# Patient Record
Sex: Male | Born: 1960 | Race: White | Hispanic: No | Marital: Married | Smoking: Never smoker
Health system: Southern US, Community
[De-identification: ages and names within clinical notes are randomized; demographics above are authoritative.]

## PROBLEM LIST (undated history)

## (undated) DIAGNOSIS — G930 Cerebral cysts: Secondary | ICD-10-CM

## (undated) DIAGNOSIS — E7800 Pure hypercholesterolemia, unspecified: Secondary | ICD-10-CM

## (undated) DIAGNOSIS — R55 Syncope and collapse: Secondary | ICD-10-CM

## (undated) DIAGNOSIS — I10 Essential (primary) hypertension: Secondary | ICD-10-CM

## (undated) DIAGNOSIS — Z87448 Personal history of other diseases of urinary system: Secondary | ICD-10-CM

## (undated) DIAGNOSIS — H6691 Otitis media, unspecified, right ear: Secondary | ICD-10-CM

## (undated) DIAGNOSIS — R7303 Prediabetes: Secondary | ICD-10-CM

## (undated) DIAGNOSIS — I2510 Atherosclerotic heart disease of native coronary artery without angina pectoris: Secondary | ICD-10-CM

## (undated) DIAGNOSIS — Z8614 Personal history of Methicillin resistant Staphylococcus aureus infection: Secondary | ICD-10-CM

## (undated) DIAGNOSIS — K449 Diaphragmatic hernia without obstruction or gangrene: Secondary | ICD-10-CM

## (undated) DIAGNOSIS — H9191 Unspecified hearing loss, right ear: Secondary | ICD-10-CM

## (undated) DIAGNOSIS — K2270 Barrett's esophagus without dysplasia: Secondary | ICD-10-CM

## (undated) DIAGNOSIS — K219 Gastro-esophageal reflux disease without esophagitis: Secondary | ICD-10-CM

## (undated) DIAGNOSIS — C4491 Basal cell carcinoma of skin, unspecified: Secondary | ICD-10-CM

## (undated) HISTORY — DX: Personal history of other diseases of urinary system: Z87.448

## (undated) HISTORY — DX: Syncope and collapse: R55

## (undated) HISTORY — PX: NOSE SURGERY: SHX723

## (undated) HISTORY — DX: Prediabetes: R73.03

## (undated) HISTORY — DX: Basal cell carcinoma of skin, unspecified: C44.91

## (undated) HISTORY — DX: Otitis media, unspecified, right ear: H66.91

## (undated) HISTORY — DX: Personal history of Methicillin resistant Staphylococcus aureus infection: Z86.14

## (undated) HISTORY — DX: Cerebral cysts: G93.0

## (undated) HISTORY — DX: Unspecified hearing loss, right ear: H91.91

## (undated) HISTORY — DX: Barrett's esophagus without dysplasia: K22.70

## (undated) HISTORY — DX: Diaphragmatic hernia without obstruction or gangrene: K44.9

---

## 2001-01-18 ENCOUNTER — Other Ambulatory Visit: Admission: RE | Admit: 2001-01-18 | Discharge: 2001-01-18 | Payer: Self-pay | Admitting: Family Medicine

## 2001-02-09 ENCOUNTER — Encounter (INDEPENDENT_AMBULATORY_CARE_PROVIDER_SITE_OTHER): Payer: Self-pay | Admitting: *Deleted

## 2001-02-09 ENCOUNTER — Ambulatory Visit (HOSPITAL_COMMUNITY): Admission: RE | Admit: 2001-02-09 | Discharge: 2001-02-09 | Payer: Self-pay | Admitting: General Surgery

## 2008-01-18 HISTORY — PX: HERNIA REPAIR: SHX51

## 2008-01-21 ENCOUNTER — Ambulatory Visit (HOSPITAL_COMMUNITY): Admission: RE | Admit: 2008-01-21 | Discharge: 2008-01-21 | Payer: Self-pay | Admitting: General Surgery

## 2008-09-05 ENCOUNTER — Encounter: Admission: RE | Admit: 2008-09-05 | Discharge: 2008-09-05 | Payer: Self-pay | Admitting: General Surgery

## 2009-06-16 ENCOUNTER — Encounter (INDEPENDENT_AMBULATORY_CARE_PROVIDER_SITE_OTHER): Payer: Self-pay | Admitting: *Deleted

## 2009-06-22 ENCOUNTER — Encounter (INDEPENDENT_AMBULATORY_CARE_PROVIDER_SITE_OTHER): Payer: Self-pay | Admitting: *Deleted

## 2009-06-24 ENCOUNTER — Ambulatory Visit: Payer: Self-pay | Admitting: Gastroenterology

## 2009-12-19 ENCOUNTER — Encounter: Admission: RE | Admit: 2009-12-19 | Discharge: 2009-12-19 | Payer: Self-pay | Admitting: Family Medicine

## 2010-02-16 NOTE — Letter (Signed)
Summary: [facility] Instructions  [facility]  [address] [place], [place] [address]   Phone: [phone]  Fax: [phone]       Nathaniel Porter    09/15/1960    MRN: [id number]        Procedure Day /Date:  07/13/09   Monday     Arrival Time:  10:30am      Procedure Time:  11:30am     Location of Procedure:                    _ x_  [facility] (4th Floor)                        PREPARATION FOR COLONOSCOPY WITH MOVIPREP   Starting 5 days prior to your procedure  07/08/09 do not eat nuts, seeds, popcorn, corn, beans, peas,  salads, or any raw vegetables.  Do not take any fiber supplements (e.g. Metamucil, Citrucel, and Benefiber).  THE DAY BEFORE YOUR PROCEDURE         DATE:  07/12/09  DAY:  Sunday  1.  Drink clear liquids the entire day-NO SOLID FOOD  2.  Do not drink anything colored red or purple.  Avoid juices with pulp.  No orange juice.  3.  Drink at least 64 oz. (8 glasses) of fluid/clear liquids during the day to prevent dehydration and help the prep work efficiently.  CLEAR LIQUIDS INCLUDE: Water Jello Ice Popsicles Tea (sugar ok, no milk/cream) Powdered fruit flavored drinks Coffee (sugar ok, no milk/cream) Gatorade Juice: apple, white grape, white cranberry  Lemonade Clear bullion, consomm, broth Carbonated beverages (any kind) Strained chicken noodle soup Hard Candy                             4.  In the morning, mix first dose of MoviPrep solution:    Empty 1 Pouch A and 1 Pouch B into the disposable container    Add lukewarm drinking water to the top line of the container. Mix to dissolve    Refrigerate (mixed solution should be used within 24 hrs)  5.  Begin drinking the prep at 5:00 p.m. The MoviPrep container is divided by 4 marks.   Every 15 minutes drink the solution down to the next Bookert (approximately 8 oz) until the full liter is complete.   6.  Follow completed prep with 16 oz of clear liquid of your choice  (Nothing red or purple).  Continue to drink clear liquids until bedtime.  7.  Before going to bed, mix second dose of MoviPrep solution:    Empty 1 Pouch A and 1 Pouch B into the disposable container    Add lukewarm drinking water to the top line of the container. Mix to dissolve    Refrigerate  THE DAY OF YOUR PROCEDURE      DATE:  07/13/09  DAY: Monday  Beginning at   6:30am (5 hours before procedure):         1. Every 15 minutes, drink the solution down to the next Jarrin (approx 8 oz) until the full liter is complete.  2. Follow completed prep with 16 oz. of clear liquid of your choice.    3. You may drink clear liquids until   9:30am (2 HOURS BEFORE PROCEDURE).   MEDICATION INSTRUCTIONS  Unless otherwise instructed, you should take regular prescription medications with a small sip  of water   as early as possible the morning of your procedure.        OTHER INSTRUCTIONS  You will need a responsible adult at least 52 years of age to accompany you and drive you home.   This person must remain in the waiting room during your procedure.  Wear loose fitting clothing that is easily removed.  Leave jewelry and other valuables at home.  However, you may wish to bring a book to read or  an iPod/MP3 player to listen to music as you wait for your procedure to start.  Remove all body piercing jewelry and leave at home.  Total time from sign-in until discharge is approximately 2-3 hours.  You should go home directly after your procedure and rest.  You can resume normal activities the  day after your procedure.  The day of your procedure you should not:   Drive   Make legal decisions   Operate machinery   Drink alcohol   Return to work  You will receive specific instructions about eating, activities and medications before you leave.    The above instructions have been reviewed and explained to me by   Wyona Almas RN  June 24, 2009 1:26 PM     I fully  understand and can verbalize these instructions _____________________________ Date _________

## 2010-02-16 NOTE — Miscellaneous (Signed)
Summary: [facility] Previsit/prep  Clinical Lists Changes  Medications: Added new medication of MOVIPREP 100 GM  SOLR (PEG-KCL-NACL-NASULF-NA ASC-C) As per prep instructions. - Signed Rx of MOVIPREP 100 GM  SOLR (PEG-KCL-NACL-NASULF-NA ASC-C) As per prep instructions.;  #1 x 0;  Signed;  Entered by: Wyona Almas RN;  Authorized by: Mardella Layman MD [facility];  Method used: Electronically to [facility] [phone]*, [address] [place], [place]  [address], Ph: [phone], Fax: [phone] Allergies: Added new allergy or adverse reaction of AMOXICILLIN Added new allergy or adverse reaction of CODEINE Observations: Added new observation of NKA: F (06/24/2009 12:56)    Prescriptions: MOVIPREP 100 GM  SOLR (PEG-KCL-NACL-NASULF-NA ASC-C) As per prep instructions.  #1 x 0   Entered by:   Wyona Almas RN   Authorized by:   Mardella Layman MD [facility]   Signed by:   Wyona Almas RN on 06/24/2009   Method used:   Electronically to        [facility] [phone]* (retail)       [address]       [place], [place]  [address]       Ph: [phone]       Fax: [phone]   RxID:   [id number]

## 2010-02-16 NOTE — Letter (Signed)
Summary: Previsit letter  [facility]  [address] [place], [place] [address]   Phone: [phone]  Fax: [phone]       06/16/2009 MRN: [id number]  Nathaniel Porter [address] [place], [place]  [address]  Dear Mr. OBARA,  Welcome to the Gastroenterology Division at [facility].    You are scheduled to see a nurse for your pre-procedure visit on 06/24/2009 at 10:30AM on the 3rd floor at [facility], [address]  We ask that you try to arrive at our office 15 minutes prior to your appointment time to allow for check-in.  Your nurse visit will consist of discussing your medical and surgical history, your immediate family medical history, and your medications.    Please bring a complete list of all your medications or, if you prefer, bring the medication bottles and we will list them.  We will need to be aware of both prescribed and over the counter drugs.  We will need to know exact dosage information as well.  If you are on blood thinners (Coumadin, Plavix, Aggrenox, Ticlid, etc.) please call our office today/prior to your appointment, as we need to consult with your physician about holding your medication.   Please be prepared to read and sign documents such as consent forms, a financial agreement, and acknowledgement forms.  If necessary, and with your consent, a friend or relative is welcome to sit-in on the nurse visit with you.  Please bring your insurance card so that we may make a copy of it.  If your insurance requires a referral to see a specialist, please bring your referral form from your primary care physician.  No co-pay is required for this nurse visit.     If you cannot keep your appointment, please call [phone] to cancel or reschedule prior to your appointment date.  This allows [place] the opportunity to schedule an appointment for another patient in need of care.    Thank you for choosing [facility] for your  medical needs.  We appreciate the opportunity to care for you.  Please visit [place] at our website  to learn more about our practice.                     Sincerely.                                                                                                                   The Gastroenterology Division

## 2010-05-03 LAB — COMPREHENSIVE METABOLIC PANEL
ALT: 27 U/L (ref 0–53)
AST: 24 U/L (ref 0–37)
Albumin: 4.1 g/dL (ref 3.5–5.2)
Alkaline Phosphatase: 54 U/L (ref 39–117)
BUN: 15 mg/dL (ref 6–23)
Chloride: 108 mEq/L (ref 96–112)
Potassium: 4.5 mEq/L (ref 3.5–5.1)
Sodium: 143 mEq/L (ref 135–145)
Total Bilirubin: 0.8 mg/dL (ref 0.3–1.2)

## 2010-05-03 LAB — CBC
HCT: 40.7 % (ref 39.0–52.0)
Platelets: 226 10*3/uL (ref 150–400)
WBC: 6.5 10*3/uL (ref 4.0–10.5)

## 2010-05-03 LAB — URINE MICROSCOPIC-ADD ON

## 2010-05-03 LAB — DIFFERENTIAL
Basophils Absolute: 0.1 10*3/uL (ref 0.0–0.1)
Basophils Relative: 1 % (ref 0–1)
Eosinophils Absolute: 0.2 10*3/uL (ref 0.0–0.7)
Eosinophils Relative: 3 % (ref 0–5)
Monocytes Absolute: 0.6 10*3/uL (ref 0.1–1.0)

## 2010-05-03 LAB — URINALYSIS, ROUTINE W REFLEX MICROSCOPIC
Glucose, UA: NEGATIVE mg/dL
Ketones, ur: NEGATIVE mg/dL
Protein, ur: NEGATIVE mg/dL

## 2010-06-01 NOTE — Op Note (Signed)
Nathaniel Porter, Nathaniel Porter               ACCOUNT NO.:  [id number]   MEDICAL RECORD NO.:  [id number]          PATIENT TYPE:  AMB   LOCATION:  DAY                          FACILITY:  [facility]   PHYSICIAN:  Sharlet Salina T. Hoxworth, M.D.DATE OF BIRTH:  12-05-1960   DATE OF PROCEDURE:  01/21/2008  DATE OF DISCHARGE:                               OPERATIVE REPORT   PREOPERATIVE DIAGNOSIS:  Right inguinal hernia.   POSTOPERATIVE DIAGNOSIS:  Right inguinal hernia.   SURGICAL PROCEDURES:  Laparoscopic repair of right inguinal hernia.   SURGEON:  Lorne Skeens. Hoxworth, M.D.   ANESTHESIA:  General.   BRIEF HISTORY:  Nathaniel Porter is a 50 year old male who presents with a  symptomatic right inguinal hernia confirmed on exam.  After discussion  of treatment options, we have elected to proceed with laparoscopic  repair.  The nature of the procedure, indications, risks of bleeding,  infection, recurrence, visceral injury and possible need for open  procedure were discussed and understood.  He is now brought to the  operating room for this procedure.   DESCRIPTION OF OPERATION:  The patient was brought to the operating room  and placed in supine position on the operating table and general  orotracheal anesthesia was induced.  The abdomen and groins down to the  thighs were widely sterilely prepped and draped.  Foley catheter had  been inserted.  He received preoperative IV antibiotics.  Correct  patient and procedure were verified.  The trocar sites were infiltrated  local anesthesia.  A 1 cm incision was made at the umbilicus and  dissection carried down to the anterior fascia which was incised  transversely for 1 cm and the medial edge of the right rectus muscle  identified and retracted laterally and the preperitoneal space entered  under direct vision.  The balloon-tipped dissector was passed down the  midline toward the pubic symphysis.  Under laparoscopic vision the  balloon was inflated with good  bilateral deployment and dissection of  the preperitoneal space.  It was left in place for a couple minutes for  hemostasis and then removed and the balloon trocar placed and CO2  pressure applied.  There had been very good dissection of the  preperitoneal space.  The pubic symphysis and Cooper's ligament were  identified and completely cleared down to the iliac vessels which were  identified and protected.  The peritoneal edge was identified out  laterally and then was further dissected laterally until the peritoneum  was completely stripped back up to the level of the umbilicus at the  anterior-superior iliac spine.  The perineum was then followed back  medially and was completely dissected off of the cord structures well  posteriorly.  There was a moderate-sized direct defect that had been  completely dissected by the balloon.  The large Bard 3-D Max right-sided  piece of mesh was chosen, placed in the preperitoneal space and  oriented.  It was then tacked medially to the pubis along Cooper's  ligament and laterally being able to feel tacks through the anterior  abdominal wall.  The superior edge was tacked back working  medially and  around the medial edge as well.  This provided very broad coverage of  the direct and indirect spaces.  The operative site inspected for  hemostasis.  There  was no bleeding.  The lateral edge of the mesh was held in place.  All  CO2 was evacuated.  Trocars removed.  The fascia at the umbilicus was  closed with figure-of-eight 0 Vicryl.  Skin was closed with subcuticular  Monocryl and Dermabond.  Sponge and needle counts were correct.  He was  taken to recovery in good condition.      Lorne Skeens. Hoxworth, M.D.  Electronically Signed     BTH/MEDQ  D:  01/21/2008  T:  01/21/2008  Job:  [id number]

## 2010-06-04 NOTE — Op Note (Signed)
[facility]  Patient:    Nathaniel Porter, Nathaniel Porter Visit Number: [id number] MRN: [id number]          Service Type: DSU Location: DAY Attending Physician:  Delsa Bern Dictated by:   Lorne Skeens. Hoxworth, M.D. Proc. Date: 02/09/01 Admit Date:  02/09/2001                             Operative Report  PREOPERATIVE DIAGNOSIS:  Recurrent basal cell carcinoma, skin left shoulder x 2.  POSTOPERATIVE DIAGNOSIS:  Recurrent basal cell carcinoma, skin left shoulder x 2.  SURGICAL PROCEDURE:  Excision of same.  SURGEON:  Lorne Skeens. Hoxworth, M.D.  ANESTHESIA:  General.  BRIEF HISTORY:  Nathaniel Porter is a 50 year old male, who approximately two and a half years ago underwent excision of two skin lesions on the top of his left shoulder which proved to be basal cell carcinoma.  Margins at that time appeared negative.  The patient has developed two recurrent areas of slightly raised erythematous, scaly skin lesions on the dorsum of the left shoulder, one measuring 4.5 x 2.5 cm, the other measuring 1.5 cm in diameter more laterally over the top of the shoulder.  Re-excision of these areas for negative margins has recommended and accepted.  The nature of the procedure, its indications, risks of bleeding, infection, and wound healing problems were discussed and understood.  He is now brought to the operating room for the procedure.  DESCRIPTION OF PROCEDURE:  The patient was brought to the operating room and placed in supine position on the operating room table, and general endotracheal anesthesia was induced.  He was carefully bumped up slightly onto his right side and padded, exposing the top of the left shoulder which was sterilely prepped and draped.  Elliptical excisions of each area along skin lines were planned with the medial excision measuring 6 x 3 cm, and the more lateral excision measuring 3 x 2 cm.  In each case, a full-thickness elliptical  excision with grossly negative margin was performed.  On the medial lesion, skin and subcutaneous flaps were raised for a couple of centimeters to allow closure under less tension.  Hemostasis was obtained with cautery.  The tissue was infiltrated with 0.25% Marcaine.  The subcu was then closed with interrupted 3-0 Vicryl and the skin with running mattress suture of 4-0 nylon in each case.  These closed with minimal tension.  Sponge, needle, and instrument counts were correct.  Dry sterile dressings were applied, and the patient was taken to recovery in satisfactory condition. Dictated by:   Lorne Skeens. Hoxworth, M.D. Attending Physician:  Delsa Bern DD:  02/09/01 TD:  02/10/01 Job: [id number]

## 2010-09-15 ENCOUNTER — Other Ambulatory Visit: Payer: Self-pay | Admitting: Dermatology

## 2010-12-15 ENCOUNTER — Other Ambulatory Visit: Payer: Self-pay | Admitting: Dermatology

## 2011-03-16 ENCOUNTER — Other Ambulatory Visit: Payer: Self-pay | Admitting: Dermatology

## 2011-08-25 ENCOUNTER — Other Ambulatory Visit: Payer: Self-pay | Admitting: Dermatology

## 2012-01-18 HISTORY — PX: OTHER SURGICAL HISTORY: SHX169

## 2012-02-22 ENCOUNTER — Other Ambulatory Visit: Payer: Self-pay | Admitting: Dermatology

## 2012-03-16 ENCOUNTER — Other Ambulatory Visit: Payer: Self-pay | Admitting: Dermatology

## 2012-04-22 ENCOUNTER — Encounter (HOSPITAL_COMMUNITY): Payer: Self-pay | Admitting: *Deleted

## 2012-04-22 ENCOUNTER — Emergency Department (HOSPITAL_COMMUNITY)
Admission: EM | Admit: 2012-04-22 | Discharge: 2012-04-22 | Disposition: A | Discharge status: Home / Self Care | Payer: No Typology Code available for payment source | Attending: Emergency Medicine | Admitting: Emergency Medicine

## 2012-04-22 ENCOUNTER — Emergency Department (HOSPITAL_COMMUNITY): Payer: No Typology Code available for payment source

## 2012-04-22 DIAGNOSIS — Z79899 Other long term (current) drug therapy: Secondary | ICD-10-CM | POA: Insufficient documentation

## 2012-04-22 DIAGNOSIS — Y92410 Unspecified street and highway as the place of occurrence of the external cause: Secondary | ICD-10-CM | POA: Insufficient documentation

## 2012-04-22 DIAGNOSIS — S139XXA Sprain of joints and ligaments of unspecified parts of neck, initial encounter: Secondary | ICD-10-CM | POA: Insufficient documentation

## 2012-04-22 DIAGNOSIS — K219 Gastro-esophageal reflux disease without esophagitis: Secondary | ICD-10-CM | POA: Insufficient documentation

## 2012-04-22 DIAGNOSIS — S161XXA Strain of muscle, fascia and tendon at neck level, initial encounter: Secondary | ICD-10-CM

## 2012-04-22 DIAGNOSIS — Y9389 Activity, other specified: Secondary | ICD-10-CM | POA: Insufficient documentation

## 2012-04-22 DIAGNOSIS — E7800 Pure hypercholesterolemia, unspecified: Secondary | ICD-10-CM | POA: Insufficient documentation

## 2012-04-22 HISTORY — DX: Gastro-esophageal reflux disease without esophagitis: K21.9

## 2012-04-22 HISTORY — DX: Pure hypercholesterolemia, unspecified: E78.00

## 2012-04-22 MED ORDER — HYDROCODONE-ACETAMINOPHEN 5-325 MG PO TABS: 1.0000 | ORAL_TABLET | Freq: Four times a day (QID) | ORAL | Status: DC | PRN

## 2012-04-22 MED ORDER — METHOCARBAMOL 500 MG PO TABS: 500.0000 mg | ORAL_TABLET | Freq: Two times a day (BID) | ORAL | Status: DC
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
## 2012-04-22 NOTE — ED Provider Notes (Signed)
Medical screening examination/treatment/procedure(s) were performed by non-physician practitioner and as supervising physician I was immediately available for consultation/collaboration.  Merl Guardino, MD 04/22/12 1532 
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
## 2012-05-15 ENCOUNTER — Telehealth: Payer: Self-pay | Admitting: Family Medicine

## 2012-05-15 NOTE — Telephone Encounter (Signed)
Left patient mess to call here and schedule follow up appt.

## 2012-05-16 ENCOUNTER — Encounter: Payer: Self-pay | Admitting: Family Medicine

## 2012-05-16 DIAGNOSIS — C4491 Basal cell carcinoma of skin, unspecified: Secondary | ICD-10-CM | POA: Insufficient documentation

## 2012-05-16 DIAGNOSIS — Z8614 Personal history of Methicillin resistant Staphylococcus aureus infection: Secondary | ICD-10-CM | POA: Insufficient documentation

## 2012-05-16 DIAGNOSIS — Z87448 Personal history of other diseases of urinary system: Secondary | ICD-10-CM | POA: Insufficient documentation

## 2012-05-16 DIAGNOSIS — K449 Diaphragmatic hernia without obstruction or gangrene: Secondary | ICD-10-CM | POA: Insufficient documentation

## 2012-05-16 DIAGNOSIS — H9191 Unspecified hearing loss, right ear: Secondary | ICD-10-CM | POA: Insufficient documentation

## 2012-05-16 DIAGNOSIS — G930 Cerebral cysts: Secondary | ICD-10-CM | POA: Insufficient documentation

## 2012-05-16 DIAGNOSIS — H6691 Otitis media, unspecified, right ear: Secondary | ICD-10-CM | POA: Insufficient documentation

## 2012-05-16 DIAGNOSIS — K2270 Barrett's esophagus without dysplasia: Secondary | ICD-10-CM | POA: Insufficient documentation

## 2012-05-16 DIAGNOSIS — K219 Gastro-esophageal reflux disease without esophagitis: Secondary | ICD-10-CM | POA: Insufficient documentation

## 2012-05-16 DIAGNOSIS — E7800 Pure hypercholesterolemia, unspecified: Secondary | ICD-10-CM | POA: Insufficient documentation

## 2012-05-17 ENCOUNTER — Encounter: Payer: Self-pay | Admitting: Physician Assistant

## 2012-05-17 ENCOUNTER — Ambulatory Visit (INDEPENDENT_AMBULATORY_CARE_PROVIDER_SITE_OTHER): Payer: BC Managed Care – PPO | Admitting: Physician Assistant

## 2012-05-17 VITALS — BP 122/70 | HR 72 | Temp 97.3°F | Resp 18 | Ht 68.0 in | Wt 155.0 lb

## 2012-05-17 DIAGNOSIS — R413 Other amnesia: Secondary | ICD-10-CM
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
## 2012-05-30 ENCOUNTER — Encounter: Payer: Self-pay | Admitting: Neurology

## 2012-05-30 DIAGNOSIS — R55 Syncope and collapse: Secondary | ICD-10-CM

## 2012-05-31 ENCOUNTER — Ambulatory Visit (INDEPENDENT_AMBULATORY_CARE_PROVIDER_SITE_OTHER): Payer: BC Managed Care – PPO | Admitting: Neurology

## 2012-05-31 ENCOUNTER — Encounter: Payer: Self-pay | Admitting: Neurology

## 2012-05-31 DIAGNOSIS — R55 Syncope and collapse: Secondary | ICD-10-CM

## 2012-05-31 DIAGNOSIS — R413 Other amnesia: Secondary | ICD-10-CM
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
## 2012-06-01 LAB — SEDIMENTATION RATE: Sed Rate: 2 mm/hr (ref 0–30)

## 2012-07-09 ENCOUNTER — Other Ambulatory Visit: Payer: Self-pay | Admitting: Dermatology

## 2012-08-06 LAB — HM COLONOSCOPY

## 2012-08-23 ENCOUNTER — Other Ambulatory Visit: Payer: Self-pay | Admitting: Dermatology

## 2012-11-22 ENCOUNTER — Other Ambulatory Visit: Payer: Self-pay

## 2012-12-24 ENCOUNTER — Encounter: Payer: Self-pay | Admitting: Physician Assistant

## 2012-12-24 ENCOUNTER — Ambulatory Visit (INDEPENDENT_AMBULATORY_CARE_PROVIDER_SITE_OTHER): Payer: BC Managed Care – PPO | Admitting: Physician Assistant

## 2012-12-24 VITALS — BP 118/78 | HR 72 | Temp 98.2°F | Resp 18 | Wt 158.0 lb

## 2012-12-24 DIAGNOSIS — J029 Acute pharyngitis, unspecified: Secondary | ICD-10-CM

## 2012-12-24 DIAGNOSIS — R52 Pain, unspecified: Secondary | ICD-10-CM

## 2012-12-24 DIAGNOSIS — J020 Streptococcal pharyngitis: Secondary | ICD-10-CM

## 2012-12-24 LAB — INFLUENZA A AND B
Inflenza A Ag: NEGATIVE
Influenza B Ag: NEGATIVE

## 2012-12-24 LAB — RAPID STREP SCREEN (MED CTR MEBANE ONLY): Streptococcus, Group A Screen (Direct): POSITIVE — AB

## 2012-12-24 MED ORDER — AZITHROMYCIN 250 MG PO TABS: ORAL_TABLET | ORAL | Status: DC

## 2012-12-24 NOTE — Progress Notes (Signed)
Patient ID: EDDI HYMES MRN: [id number], DOB: 05-01-1960, 52 y.o. Date of Encounter: 12/24/2012, 12:37 PM    Chief Complaint:  Chief Complaint  Patient presents with  . sore throat, fever, achy all weekend     HPI: 52 y.o. year old white male  With that he began to develop a sore throat on the afternoon of Friday, 12/21/2012. Continued to have sore throat since then. As well he has been having some fevers mostly in the evening. Last night was at 100.6. He's had no mucus from his nose. No cough or chest congestion.     Home Meds: See attached medication section for any medications that were entered at today's visit. The computer does not put those onto this list.The following list is a list of meds entered prior to today's visit.   Current Outpatient Prescriptions on File Prior to Visit  Medication Sig Dispense Refill  . atorvastatin (LIPITOR) 80 MG tablet Take 40 mg by mouth daily.      . fish oil-omega-3 fatty acids 1000 MG capsule Take 1 g by mouth daily.      Marland Kitchen OMEPRAZOLE PO Take 1 tablet by mouth daily.       No current facility-administered medications on file prior to visit.    Allergies:  Allergies  Allergen Reactions  . Amoxicillin     REACTION: Rash/shortness of breath  . Codeine     REACTION: iritability  . Penicillins       Review of Systems: See HPI for pertinent ROS. All other ROS negative.    Physical Exam: Blood pressure 118/78, pulse 72, temperature 98.2 F (36.8 C), temperature source Oral, resp. rate 18, weight 158 lb (71.668 kg)., Body mass index is 24.03 kg/(m^2). General:  WNWD WM Appears in no acute distress. HEENT: Normocephalic, atraumatic, eyes without discharge, sclera non-icteric, nares are without discharge. Bilateral auditory canals clear, TM's are without perforation, pearly grey and translucent with reflective cone of light bilaterally. Oral cavity moist, posterior pharynx without exudate, peritonsillar abscess. Exterior pharynx is  with moderate to severe erythema. Deep beefy red. No exudate.  Neck: Supple. No thyromegaly. Mildly tender bilateral cervical lymph nodes. Minimally enlarged. Lungs: Clear bilaterally to auscultation without wheezes, rales, or rhonchi. Breathing is unlabored. Heart: Regular rhythm. No murmurs, rubs, or gallops. Msk:  Strength and tone normal for age. Extremities/Skin: Warm and dry. No clubbing or cyanosis. No edema. No rashes or suspicious lesions. Neuro: Alert and oriented X 3. Moves all extremities spontaneously. Gait is normal. CNII-XII grossly in tact. Psych:  Responds to questions appropriately with a normal affect.   Results for orders placed in visit on 12/24/12  RAPID STREP SCREEN      Result Value Range   Source THROAT     Streptococcus, Group A Screen (Direct) POS (*) NEGATIVE  INFLUENZA A AND B      Result Value Range   Source-INFBD NASAL     Inflenza A Ag NEG  Negative   Influenza B Ag NEG  Negative     ASSESSMENT AND PLAN:  52 y.o. year old male with  1. Strep pharyngitis Allergy to penicillin and amoxicillin. - azithromycin (ZITHROMAX) 250 MG tablet; Take 2 daily for 5 days  Dispense: 10 tablet; Refill: 0 Note out of work Monday, 12/24/2012 through Wednesday, 12/26/2012. Use lozenges sprays Tylenol Motrin as needed for pain. F/U if needed.  2. Sore throat - Rapid Strep Screen - Influenza a and b  3. Body aches - Rapid  Strep Screen - Influenza a and b   Signed, [address] [place], [place], [facility] 12/24/2012 12:37 PM

## 2013-01-07 ENCOUNTER — Encounter: Payer: Self-pay | Admitting: Physician Assistant

## 2013-01-07 ENCOUNTER — Ambulatory Visit (INDEPENDENT_AMBULATORY_CARE_PROVIDER_SITE_OTHER): Payer: BC Managed Care – PPO | Admitting: Physician Assistant

## 2013-01-07 VITALS — BP 144/98 | HR 80 | Temp 98.2°F | Resp 18 | Wt 159.0 lb

## 2013-01-07 DIAGNOSIS — J029 Acute pharyngitis, unspecified: Secondary | ICD-10-CM

## 2013-01-07 DIAGNOSIS — J020 Streptococcal pharyngitis: Secondary | ICD-10-CM

## 2013-01-07 MED ORDER — AZITHROMYCIN 250 MG PO TABS: ORAL_TABLET | ORAL | Status: DC

## 2013-01-07 NOTE — Progress Notes (Signed)
Patient ID: Nathaniel Porter MRN: [id number], DOB: 1960-07-01, 52 y.o. Date of Encounter: 01/07/2013, 2:56 PM    Chief Complaint:  Chief Complaint  Patient presents with  . recurrent sore throat     HPI: 52 y.o. year old white male was seen here by me on 12/24/12. At that time his only symptom was sore for 3 days and some fever. Strep test was positive. He is allergic to penicillin/amoxicillin he was treated with azithromycin 500 mg daily x5 days. He says that he did take all the antibiotic as directed. Says that the sore throat resolved and he was feeling normal.  However, on Friday 01/04/13 he started to develop sore throat again. He is continued with sore throat since then. He has very minimal nasal congestion. Getting no mucus or drainage out of his nose. With this episode he has had no fever. No chest congestion or cough. No earache.  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     Home Meds: See attached medication section for any medications that were entered at today's visit. The computer does not put those onto this list.The following list is a list of meds entered prior to today's visit.   Current Outpatient Prescriptions on File Prior to Visit  Medication Sig Dispense Refill  . atorvastatin (LIPITOR) 80 MG tablet Take 40 mg by mouth daily.      . fish oil-omega-3 fatty acids 1000 MG capsule Take 1 g by mouth daily.      Marland Kitchen OMEPRAZOLE PO Take 1 tablet by mouth daily.       No current facility-administered medications on file prior to visit.    Allergies:  Allergies  Allergen Reactions  . Amoxicillin     REACTION: Rash/shortness of breath  . Codeine     REACTION: iritability  . Penicillins       Review of Systems: See HPI for pertinent ROS. All other ROS negative.    Physical Exam: Blood pressure 144/98, pulse 80, temperature 98.2 F (36.8 C), temperature source Oral, resp. rate  18, weight 159 lb (72.122 kg)., Body mass index is 24.18 kg/(m^2). General:  WNWD wM. Appears in no acute distress. HEENT: Normocephalic, atraumatic, eyes without discharge, sclera non-icteric, nares are without discharge. Bilateral auditory canals clear, TM's are without perforation, pearly grey and translucent with reflective cone of light bilaterally. Oral cavity moist, posterior pharynx without exudate, peritonsillar abscess. Minimal erythema.  Neck: Supple. No thyromegaly. No lymphadenopathy. Lungs: Clear bilaterally to auscultation without wheezes, rales, or rhonchi. Breathing is unlabored. Heart: Regular rhythm. No murmurs, rubs, or gallops. Msk:  Strength and tone normal for age. Extremities/Skin: Warm and dry. No clubbing or cyanosis. No edema. No rashes or suspicious lesions. Neuro: Alert and oriented X 3. Moves all extremities spontaneously. Gait is normal. CNII-XII grossly in tact. Psych:  Responds to questions appropriately with a normal affect.   Results for orders placed in visit on 01/07/13  RAPID STREP SCREEN      Result Value Range   Source THROAT     Streptococcus, Group A Screen (Direct) NEG  NEGATIVE     ASSESSMENT AND PLAN:  52 y.o. year old male with  1. Acute pharyngitis  2. Sore throat - Rapid Strep Screen  It sounds like his strep pharyngitis treated on 12/24/12 resolved with the azithromycin. It sounds like he has probably gotten a new infection now.  The RST is negative. Most likely, this infection is viral.   I discussed this with the patient. Discussed that if this infection is viral and it will run its course and spontaneously resolve. He agrees that he will treat this as a virus for now with symptomatic management. However, given that the Christmas holiday he is in just a few days, I will go ahead and send in a prescription for antibiotic in case this is needed. However he agrees that he will only get picked this up if his symptoms significantly worsen or if  they are not resolving after another 2 days. In the interim, he is to use lozenges and spray and Tylenol/Motrin to help with the pain. If he does start the antibiotics and he is to complete all of the antibiotics.   Murray Hodgkins Vance, [place], [facility] 01/07/2013 2:56 PM

## 2013-02-05 ENCOUNTER — Other Ambulatory Visit: Payer: Self-pay | Admitting: Family Medicine

## 2013-03-08 ENCOUNTER — Telehealth: Payer: Self-pay | Admitting: Family Medicine

## 2013-03-08 NOTE — Telephone Encounter (Signed)
2 week C/O rt flank pain.  Nagging pain.  No problems with bowels or bladder.  Thought it was muscle but not going away.  Wanted to be seen today but no appt available.  Offered appt for next week.  Pt said he would call back if still wants to be seen.

## 2013-03-12 ENCOUNTER — Ambulatory Visit (INDEPENDENT_AMBULATORY_CARE_PROVIDER_SITE_OTHER): Payer: BC Managed Care – PPO | Admitting: Family Medicine

## 2013-03-12 ENCOUNTER — Encounter: Payer: Self-pay | Admitting: Family Medicine

## 2013-03-12 VITALS — BP 134/70 | HR 80 | Temp 97.1°F | Resp 16 | Ht 66.0 in | Wt 159.0 lb

## 2013-03-12 DIAGNOSIS — M545 Low back pain, unspecified: Secondary | ICD-10-CM

## 2013-03-12 DIAGNOSIS — R079 Chest pain, unspecified: Secondary | ICD-10-CM

## 2013-03-12 NOTE — Progress Notes (Signed)
Subjective:    Patient ID: Nathaniel Porter, male    DOB: 09/10/1960, 53 y.o.   MRN: [id number]  HPI Patient presents with atypical chest pain the last 2-3 weeks. It occurs on a daily basis. It is substernal in location. It is not associated with exercise. There is some mild shortness of breath. He describes the pain as a constant pressure sensation. He does have a past medical history of a hiatal hernia. However he denies any indigestion. He denies any melena. He denies any pleurisy or cough.  He is also concerned because he is having right flank pain for one month. Began he lifted some heavy equipment. He denies any sciatica. The pain is located over the right lower latissimus dorsi. He denies any hematuria dysuria or frequency. Past Medical History  Diagnosis Date  . GERD (gastroesophageal reflux disease)   . High cholesterol   . Cerebral cyst   . Chronic otitis media of right ear   . 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 H/O hematuria   . Barrett esophagus   . Hiatal hernia   . Hx MRSA infection     nose  . Syncope   . BCC (basal cell carcinoma)    Past Surgical History  Procedure Laterality Date  . Hernia repair Right 01/2008  . Nose surgery      x 3-4  . Mole surgery  2014    basil cell   Current Outpatient Prescriptions on File Prior to Visit  Medication Sig Dispense Refill  . atorvastatin (LIPITOR) 80 MG tablet TAKE 1 TABLET BY MOUTH EVERY DAY  30 tablet  6  . fish oil-omega-3 fatty acids 1000 MG capsule Take 1 g by mouth daily.       No current facility-administered medications on file prior to visit.   Allergies  Allergen Reactions  . Amoxicillin     REACTION: Rash/shortness of breath  . Codeine     REACTION: iritability  . Penicillins    History   Social History  . Marital Status: Married    Spouse Name: N/A    Number of Children: 2  . Years of Education: 12   Occupational History  .  [place] History Main Topics  . Smoking status: Never  Smoker   . Smokeless tobacco: Never Used  . Alcohol Use: No  . Drug Use: No  . Sexual Activity: Not on file   Other Topics Concern  . Not on file   Social History Narrative   Patient lives at home with his wife . 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 He denies smoking, drinking.      Review of Systems  All other systems reviewed and are negative.       Objective:   Physical Exam  Vitals reviewed. Constitutional: He appears well-developed and well-nourished.  Cardiovascular: Normal rate, regular rhythm and normal heart sounds.  Exam reveals no gallop and no friction rub.   No murmur heard. Pulmonary/Chest: Effort normal and breath sounds normal. No respiratory distress. He has no wheezes. He has no rales. He exhibits no tenderness.  Abdominal: Soft. Bowel sounds are normal. He exhibits no distension and no mass. There is no tenderness. There is no rebound and no guarding.  Musculoskeletal:       Lumbar back: He exhibits tenderness and pain. He exhibits normal range of motion, no bony tenderness and no spasm.   EKG shows  normal sinus rhythm 75 beats per minute with no evidence of ischemia or infarction.       Assessment & Plan:  1. Chest pain Chest pain is atypical in nature. I cannot exclude a cardiac cause for his chest pain. I will set the patient up as an outpatient for possible exercise treadmill test given his age, his history of hyperlipidemia. That being said, I believe the most likely source of his pain is likely his hiatal hernia. Also patient on dexilant 60 mg by mouth daily and discontinue omeprazole. Objective patient back in one week. If stress test is normal index on is not helping I recommended chest x-ray - EKG 12-Lead - Ambulatory referral to Cardiology  2. Right-sided low back pain without sciatica I suspect muscle strain in the right side of his back. Once his chest pain is better we  can try the patient on a taper pack of prednisone. However I am concerned prednisone or NSAIDs made exacerbate his hiatal hernia and therefore I will not prescribe them at the present time. I explained this at length to the patient and he is amenable to that plan.

## 2013-03-20 ENCOUNTER — Other Ambulatory Visit: Payer: Self-pay | Admitting: Family Medicine

## 2013-03-20 MED ORDER — OMEPRAZOLE MAGNESIUM 20 MG PO TBEC: 20.0000 mg | DELAYED_RELEASE_TABLET | Freq: Every day | ORAL | Status: DC

## 2013-03-22 ENCOUNTER — Telehealth: Payer: Self-pay | Admitting: Physician Assistant

## 2013-03-22 NOTE — Telephone Encounter (Signed)
Call back number is [phone] Pharmacy [facility] Pt is calling in regards to the sample Dexalant that Dr Dennard Schaumann gave him he states that it helps okay. It kinda messes with his stomach and helps out with the refluxes some

## 2013-03-25 MED ORDER — PREDNISONE 20 MG PO TABS: ORAL_TABLET | ORAL | Status: DC

## 2013-03-25 NOTE — Telephone Encounter (Signed)
Spoke to pt 's wife and chest pain is better but back is still hurting. Informed wife that I will send in prednisone and if he is no better then he needs a f/u appt.  Med sent to pharm.

## 2013-03-25 NOTE — Telephone Encounter (Signed)
If his chest pain is no better, NTBS.  If better, I would try a prednisone taper pack for his back pain if still necessary.

## 2013-03-29 ENCOUNTER — Telehealth: Payer: Self-pay | Admitting: Physician Assistant

## 2013-03-29 ENCOUNTER — Telehealth: Payer: Self-pay | Admitting: *Deleted

## 2013-03-29 DIAGNOSIS — K219 Gastro-esophageal reflux disease without esophagitis: Secondary | ICD-10-CM

## 2013-03-29 DIAGNOSIS — K449 Diaphragmatic hernia without obstruction or gangrene: Secondary | ICD-10-CM

## 2013-03-29 MED ORDER — DEXLANSOPRAZOLE 60 MG PO CPDR: 60.0000 mg | DELAYED_RELEASE_CAPSULE | Freq: Every day | ORAL | Status: DC

## 2013-03-29 NOTE — Telephone Encounter (Signed)
Received fax from pharmacy requesting PA for [place].   Pa submitted.

## 2013-03-29 NOTE — Telephone Encounter (Signed)
Call back number is [phone] Pharmacy is [facility] PT was given samples of  Dexilant and his wife has called stating that her husband is wanting a regular prescription and if he can, can you please call and let him know that he will be able to have it

## 2013-03-29 NOTE — Telephone Encounter (Signed)
Refill completed and patient aware

## 2013-04-03 ENCOUNTER — Ambulatory Visit (INDEPENDENT_AMBULATORY_CARE_PROVIDER_SITE_OTHER): Payer: BC Managed Care – PPO | Admitting: Cardiovascular Disease

## 2013-04-03 ENCOUNTER — Encounter: Payer: Self-pay | Admitting: Cardiovascular Disease

## 2013-04-03 VITALS — BP 117/75 | HR 78 | Ht 66.0 in | Wt 157.4 lb

## 2013-04-03 DIAGNOSIS — R079 Chest pain, unspecified: Secondary | ICD-10-CM

## 2013-04-03 DIAGNOSIS — K449 Diaphragmatic hernia without obstruction or gangrene: Secondary | ICD-10-CM

## 2013-04-03 DIAGNOSIS — E7800 Pure hypercholesterolemia, unspecified: Secondary | ICD-10-CM

## 2013-04-03 NOTE — Progress Notes (Signed)
Patient ID: Nathaniel Porter, male   DOB: 1960-11-04, 53 y.o.   MRN: [id number] Referred by Dr Dennard Schaumann  with atypical chest pain the last 2-3 weeks. It occurs on a daily basis. It is substernal in location. It is not associated with exercise. There is some mild shortness of breath. He describes the pain as a constant pressure sensation. He does have a past medical history of a hiatal hernia. However he denies any indigestion. He denies any melena. He denies any pleurisy or cough. Pain can be worse at night.  Some relief with dexilant  Had normal stress test about 10 years ago.  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  No excess ETOH, smoking or drugs.  Denies palpitations syncope No recent trauma.      ROS: Denies fever, malais, weight loss, blurry vision, decreased visual acuity, cough, sputum, SOB, hemoptysis, pleuritic pain, palpitaitons, heartburn, abdominal pain, melena, lower extremity edema, claudication, or rash.  All other systems reviewed and negative   General: Affect appropriate Healthy:  appears stated age 53: normal Neck supple with no adenopathy JVP normal no bruits no thyromegaly Lungs clear with no wheezing and good diaphragmatic motion Heart:  S1/S2 no murmur,rub, gallop or click PMI normal Abdomen: benighn, BS positve, no tenderness, no AAA no bruit.  No HSM or HJR Distal pulses intact with no bruits No edema Neuro non-focal Skin warm and dry No muscular weakness  Medications Current Outpatient Prescriptions  Medication Sig Dispense Refill  . atorvastatin (LIPITOR) 80 MG tablet TAKE 1 TABLET BY MOUTH EVERY DAY  30 tablet  6  . dexlansoprazole (DEXILANT) 60 MG capsule Take 1 capsule (60 mg total) by mouth daily.  30 capsule  5  . fish oil-omega-3 fatty acids 1000 MG capsule Take 1 g by mouth daily.      . predniSONE (DELTASONE) 20 MG tablet 3 tabs po qd x 2 days, 2 tabs po qd x 2 days, 1 tab po qd x 2 days  12 tablet  0   No current  facility-administered medications for this visit.    Allergies Amoxicillin; Codeine; and Penicillins  Family History: Family History  Problem Relation Age of Onset  . Cancer Father     bladder  . Hypertension Brother   . Diabetes Paternal Uncle     Social History: History   Social History  . Marital Status: Married    Spouse Name: N/A    Number of Children: 2  . Years of Education: 12   Occupational History  .  [place] History Main Topics  . Smoking status: Never Smoker   . Smokeless tobacco: Never Used  . Alcohol Use: No  . Drug Use: No  . Sexual Activity: Not on file   Other Topics Concern  . Not on file   Social History Narrative   Patient lives at home with his wife . 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 He denies smoking, drinking.    Electrocardiogram:  NS[facility] normal ECG  [facility]'s office 2/15  Assessment and Plan

## 2013-04-03 NOTE — Patient Instructions (Signed)
Your physician recommends that you schedule a follow-up appointment in:   AS NEEDED   Your physician recommends that you continue on your current medications as directed. Please refer to the Current Medication list given to you today.   Your physician has requested that you have a stress echocardiogram. For further information please visit [email or web address]. Please follow instruction sheet as given.  

## 2013-04-03 NOTE — Assessment & Plan Note (Signed)
Continue lipitor would consider decreasing dose to 40 mg to reduce side effect potential

## 2013-04-03 NOTE — Assessment & Plan Note (Signed)
May be the source of some of his atypical pain.  Continue Dexilant  F?U GI/primary Discussed low carb diet and foods to avoid

## 2013-04-08 NOTE — Telephone Encounter (Signed)
Resubmitted further paperwork as requested by ins company

## 2013-04-19 ENCOUNTER — Ambulatory Visit (HOSPITAL_BASED_OUTPATIENT_CLINIC_OR_DEPARTMENT_OTHER): Payer: BC Managed Care – PPO

## 2013-04-19 ENCOUNTER — Ambulatory Visit (HOSPITAL_COMMUNITY): Payer: BC Managed Care – PPO | Attending: Cardiovascular Disease | Admitting: Radiology

## 2013-04-19 DIAGNOSIS — R079 Chest pain, unspecified: Secondary | ICD-10-CM

## 2013-04-19 DIAGNOSIS — R072 Precordial pain: Secondary | ICD-10-CM

## 2013-04-19 DIAGNOSIS — R0989 Other specified symptoms and signs involving the circulatory and respiratory systems: Secondary | ICD-10-CM

## 2013-04-19 NOTE — Progress Notes (Signed)
Stress Echocardiogram performed.  

## 2013-04-24 ENCOUNTER — Other Ambulatory Visit: Payer: Self-pay | Admitting: Family Medicine

## 2013-04-24 NOTE — Telephone Encounter (Signed)
LMOVM to call back to see if he wants me to call in one of the other medications

## 2013-04-24 NOTE — Telephone Encounter (Signed)
Dexilant has been denied - has to show an intolerance to omeprazole and Nexium - Tried to call pt on home number with no answer and no vm.

## 2013-05-01 ENCOUNTER — Other Ambulatory Visit: Payer: Self-pay | Admitting: *Deleted

## 2013-05-01 ENCOUNTER — Telehealth: Payer: Self-pay | Admitting: *Deleted

## 2013-05-01 MED ORDER — OMEPRAZOLE 20 MG PO CPDR: 20.0000 mg | DELAYED_RELEASE_CAPSULE | Freq: Every day | ORAL | Status: DC

## 2013-05-01 NOTE — Telephone Encounter (Signed)
Received fax from pharmacy requesting PA for Omeprazole.   PA submitted.

## 2013-05-01 NOTE — Telephone Encounter (Signed)
Call returned from patient.   Reports that he will continue Omeprazole.   Medication profile updated.

## 2013-05-08 NOTE — Telephone Encounter (Signed)
PA determination received.   PA approved.   04/17/2013- 05/08/2014.  Case ID: [id number].  Pharmacy made aware.

## 2013-05-08 NOTE — Telephone Encounter (Signed)
Insurance sent form for more information - form filled out and faxed back to [facility]

## 2013-08-29 ENCOUNTER — Encounter: Payer: Self-pay | Admitting: *Deleted

## 2013-08-29 ENCOUNTER — Encounter: Payer: Self-pay | Admitting: Physician Assistant

## 2013-08-29 DIAGNOSIS — K22711 Barrett's esophagus with high grade dysplasia: Secondary | ICD-10-CM | POA: Insufficient documentation

## 2013-09-13 ENCOUNTER — Other Ambulatory Visit: Payer: Self-pay | Admitting: Dermatology

## 2013-10-03 ENCOUNTER — Encounter: Payer: Self-pay | Admitting: Family Medicine

## 2013-10-03 ENCOUNTER — Ambulatory Visit (INDEPENDENT_AMBULATORY_CARE_PROVIDER_SITE_OTHER): Payer: BC Managed Care – PPO | Admitting: Family Medicine

## 2013-10-03 VITALS — BP 100/68 | HR 82 | Temp 98.9°F | Resp 12 | Ht 67.0 in | Wt 160.0 lb

## 2013-10-03 DIAGNOSIS — J029 Acute pharyngitis, unspecified: Secondary | ICD-10-CM

## 2013-10-03 LAB — RAPID STREP SCREEN (MED CTR MEBANE ONLY): Streptococcus, Group A Screen (Direct): NEGATIVE

## 2013-10-03 NOTE — Progress Notes (Signed)
Subjective:    Patient ID: Nathaniel Porter, male    DOB: 08/29/1960, 53 y.o.   MRN: [id number]  HPI Patient symptoms began last Friday approximately 6 days ago. He developed a sore throat. He said an occasional itchy cough. The cough is nonproductive. He reports subjective fevers. The sore throat is not responding to [facility] or over-the-counter cold medications. He denies any rhinorrhea. He denies any sinus pain. He denies any otalgia. He denies any shortness of breath. He denies any nausea vomiting or diarrhea. Past Medical History  Diagnosis Date  . GERD (gastroesophageal reflux disease)   . High cholesterol   . Cerebral cyst   . Chronic otitis media of right ear   . 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 H/O hematuria   . Barrett esophagus   . Hiatal hernia   . Hx MRSA infection     nose  . Syncope   . BCC (basal cell carcinoma)    Past Surgical History  Procedure Laterality Date  . Hernia repair Right 01/2008  . Nose surgery      x 3-4  . Mole surgery  2014    basil cell   Current Outpatient Prescriptions on File Prior to Visit  Medication Sig Dispense Refill  . atorvastatin (LIPITOR) 80 MG tablet TAKE 1 TABLET BY MOUTH EVERY DAY  30 tablet  6  . fish oil-omega-3 fatty acids 1000 MG capsule Take 1 g by mouth daily.      Marland Kitchen omeprazole (PRILOSEC) 20 MG capsule Take 1 capsule (20 mg total) by mouth daily.  30 capsule  3   No current facility-administered medications on file prior to visit.   Allergies  Allergen Reactions  . Amoxicillin     REACTION: Rash/shortness of breath  . Codeine     REACTION: iritability  . Penicillins    History   Social History  . Marital Status: Married    Spouse Name: N/A    Number of Children: 2  . Years of Education: 12   Occupational History  .  [place] History Main Topics  . Smoking status: Never Smoker   . Smokeless tobacco: Never Used  . Alcohol Use: No  . Drug Use: No  . Sexual Activity: Not on file   Other Topics  Concern  . Not on file   Social History Narrative   Patient lives at home with his wife . 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 He denies smoking, drinking.      Review of Systems  All other systems reviewed and are negative.      Objective:   Physical Exam  Vitals reviewed. Constitutional: He appears well-developed and well-nourished. No distress.  HENT:  Right Ear: External ear normal.  Left Ear: External ear normal.  Nose: Nose normal.  Mouth/Throat: Oropharynx is clear and moist. No oropharyngeal exudate.  Eyes: Conjunctivae are normal. Pupils are equal, round, and reactive to light.  Neck: Neck supple.  Cardiovascular: Normal rate, regular rhythm and normal heart sounds.   Pulmonary/Chest: Effort normal and breath sounds normal. No respiratory distress. He has no wheezes. He has no rales.  Abdominal: Soft. Bowel sounds are normal. He exhibits no distension. There is no tenderness. There is no rebound and no guarding.  Lymphadenopathy:    He has no cervical adenopathy.  Skin: He is not diaphoretic.          Assessment & Plan:  Sore throat - Plan: Rapid strep screen  Patient's exam is benign. His posterior oropharynx is not erythematous. There is no edema. There is no exudate. I will check a strep test.  Strep test today is negative. Therefore I think the patient has viral pharyngitis. I recommended Duke's Magic mouthwash 1 teaspoon gargle and swallow every 4 hours as needed for sore throat. Recommended tincture of time. I anticipate spontaneous resolution in the next 48-72 hours. Call immediately if symptoms worsen.

## 2013-10-23 ENCOUNTER — Telehealth: Payer: Self-pay | Admitting: Physician Assistant

## 2013-10-23 MED ORDER — AZITHROMYCIN 250 MG PO TABS: ORAL_TABLET | ORAL | Status: DC

## 2013-10-23 NOTE — Telephone Encounter (Signed)
Script called in and pt aware of message

## 2013-10-23 NOTE — Telephone Encounter (Signed)
I reviewed Dr. Samella Parr note. Call patient and tell him that we will send in a prescription for antibiotic. Send prescription for azithromycin 250 mg--Day 1:  take 2 daily. On days 2 through 5:  take one daily.  Dispense #6 which is one pack +0 additional refills Follow up if symptoms do not resolve within one week after completion of antibiotic.

## 2013-10-23 NOTE — Telephone Encounter (Signed)
[phone]  [facility]  Pt is not any better and would like to know if he could have something else called (seen Dr Dennard Schaumann last week) Symptoms: throat itching and coughing a lot (alot worse in morning and night time)

## 2014-01-06 ENCOUNTER — Other Ambulatory Visit: Payer: Self-pay | Admitting: Family Medicine

## 2014-01-07 ENCOUNTER — Ambulatory Visit (INDEPENDENT_AMBULATORY_CARE_PROVIDER_SITE_OTHER): Payer: BC Managed Care – PPO | Admitting: Family Medicine

## 2014-01-07 DIAGNOSIS — H6123 Impacted cerumen, bilateral: Secondary | ICD-10-CM

## 2014-01-07 MED ORDER — CARBAMIDE PEROXIDE 6.5 % OT SOLN: 5.0000 [drp] | Freq: Two times a day (BID) | OTIC | Status: DC

## 2014-01-07 NOTE — Progress Notes (Signed)
Pt was told to come for nurse visit because he wanted his plugged ears washed out.  Left ear was clear and ear drum easily visualized.  Right ear completely plugged with wax.  Attempted to wash out right ear with warm water and peroxide.  Pt became very dizzy.  Had him to lie down.  Was able to remove some ear wax.  Ear canal very irritated and pt continued to be more dizzy even lying down.  I asked Dr Dennard Schaumann to check his ear.  He states redness just from the wax being lodged against his ear.  He did not recommend that we continue to irrigate due to dizziness.  Had me put in order for Debrox wear drops for pt and told pt how to use.  Told pt this should resolve ear wax without further intervention.  I told pt if ear does not improve with using ear drops to call and make appt with provider.

## 2014-01-07 NOTE — Telephone Encounter (Signed)
Nystatin is for yeast infections which I doubt is causing his sore throat.  Try chloraseptic instead.

## 2014-01-07 NOTE — Telephone Encounter (Signed)
Has appt today to have "ears cleaned out"  He was given a nurse visit appt. Then rec'd refill for Nystatin mouthwash from pharmacy.  Told will assess at OV for need for refill.  Said throat a little scratchy like in September and though more of this would help.

## 2014-01-07 NOTE — Telephone Encounter (Signed)
Ok to refill 

## 2014-01-13 NOTE — Progress Notes (Signed)
   Subjective:    Patient ID: Nathaniel Porter, male    DOB: Mar 16, 1960, 53 y.o.   MRN: [id number]  HPI Patient came in with bilateral cerumen impactions. My nurse clear the impaction is using irrigation and lavage. Patient tolerated the procedure well but experienced some dizziness and therefore we discontinued the procedure before complete clearance of the cerumen impaction. Patient was recommended to try Murine ear drops for 5 days   Review of Systems     Objective:   Physical Exam        Assessment & Plan:

## 2014-01-14 NOTE — Telephone Encounter (Signed)
Pt was informed of provider recommendation when he came later that day for nurse visit

## 2014-03-09 ENCOUNTER — Other Ambulatory Visit: Payer: Self-pay | Admitting: Family Medicine

## 2014-03-28 ENCOUNTER — Other Ambulatory Visit: Payer: Self-pay | Admitting: Dermatology

## 2014-04-05 ENCOUNTER — Other Ambulatory Visit: Payer: Self-pay | Admitting: Family Medicine

## 2014-04-09 ENCOUNTER — Other Ambulatory Visit: Payer: Self-pay | Admitting: Family Medicine

## 2014-04-09 ENCOUNTER — Encounter: Payer: Self-pay | Admitting: Family Medicine

## 2014-04-09 NOTE — Telephone Encounter (Signed)
Medication refill for one time only.  Patient needs to be seen.  Letter sent for patient to call and schedule 

## 2014-04-17 ENCOUNTER — Ambulatory Visit (INDEPENDENT_AMBULATORY_CARE_PROVIDER_SITE_OTHER): Payer: BC Managed Care – PPO | Admitting: Family Medicine

## 2014-04-17 ENCOUNTER — Encounter: Payer: Self-pay | Admitting: Family Medicine

## 2014-04-17 ENCOUNTER — Telehealth: Payer: Self-pay | Admitting: *Deleted

## 2014-04-17 VITALS — BP 104/68 | HR 76 | Temp 98.0°F | Resp 14 | Ht 67.0 in | Wt 164.0 lb

## 2014-04-17 DIAGNOSIS — E785 Hyperlipidemia, unspecified: Secondary | ICD-10-CM

## 2014-04-17 DIAGNOSIS — Z125 Encounter for screening for malignant neoplasm of prostate: Secondary | ICD-10-CM | POA: Diagnosis not present

## 2014-04-17 DIAGNOSIS — M79643 Pain in unspecified hand: Secondary | ICD-10-CM

## 2014-04-17 LAB — COMPLETE METABOLIC PANEL WITH GFR
ALT: 40 U/L (ref 0–53)
AST: 26 U/L (ref 0–37)
Albumin: 4.4 g/dL (ref 3.5–5.2)
Alkaline Phosphatase: 76 U/L (ref 39–117)
BILIRUBIN TOTAL: 1 mg/dL (ref 0.2–1.2)
BUN: 14 mg/dL (ref 6–23)
CO2: 26 meq/L (ref 19–32)
Calcium: 9 mg/dL (ref 8.4–10.5)
Chloride: 105 mEq/L (ref 96–112)
Creat: 0.86 mg/dL (ref 0.50–1.35)
GFR, Est African American: >89 mL/min
GLUCOSE: 99 mg/dL (ref 70–99)
Potassium: 3.8 mEq/L (ref 3.5–5.3)
SODIUM: 142 meq/L (ref 135–145)
TOTAL PROTEIN: 6.6 g/dL (ref 6.0–8.3)

## 2014-04-17 LAB — LIPID PANEL
Cholesterol: 182 mg/dL (ref 0–200)
HDL: 29 mg/dL — ABNORMAL LOW (ref >=40)
LDL CALC: 110 mg/dL — AB (ref 0–99)
TRIGLYCERIDES: 213 mg/dL — AB (ref <150)
Total CHOL/HDL Ratio: 6.3 Ratio
VLDL: 43 mg/dL — AB (ref 0–40)

## 2014-04-17 MED ORDER — CARBAMIDE PEROXIDE 6.5 % OT SOLN: 5.0000 [drp] | Freq: Two times a day (BID) | OTIC | Status: DC

## 2014-04-17 MED ORDER — DICLOFENAC SODIUM 1 % TD GEL: 2.0000 g | Freq: Four times a day (QID) | TRANSDERMAL | Status: DC

## 2014-04-17 NOTE — Telephone Encounter (Signed)
Received request from pharmacy for PA on Voltaren Gel.   Patient has had hx of GI bleed.   PA submitted.   Dx: T61.443  PA approved.   Pharmacy made aware.

## 2014-04-17 NOTE — Progress Notes (Signed)
Subjective:    Patient ID: Nathaniel Porter, male    DOB: [birth year], 54 y.o.   MRN: [id number]  HPI Patient is here today to get his cholesterol rechecked. He denies any myalgias or right upper quadrant pain on his Lipitor. He also complains of some pain in the joints on his hand. He is afraid of trying over-the-counter NSAIDs due to his gastrointestinal history. He is also due for a prostate exam Past Medical History  Diagnosis Date  . GERD (gastroesophageal reflux disease)   . High cholesterol   . Cerebral cyst   . Chronic otitis media of right ear   . 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 H/O hematuria   . Barrett esophagus   . Hiatal hernia   . Hx MRSA infection     nose  . Syncope   . BCC (basal cell carcinoma)    Past Surgical History  Procedure Laterality Date  . Hernia repair Right 01/2008  . Nose surgery      x 3-4  . Mole surgery  2014    basil cell   Current Outpatient Prescriptions on File Prior to Visit  Medication Sig Dispense Refill  . atorvastatin (LIPITOR) 80 MG tablet TAKE 1 TABLET BY MOUTH EVERY DAY 30 tablet 2  . [facility] OMEPRAZOLE 20 MG TBEC TAKE 1 TABLET (20 MG TOTAL) BY MOUTH DAILY. 30 tablet 0  . fish oil-omega-3 fatty acids 1000 MG capsule Take 1 g by mouth daily.     No current facility-administered medications on file prior to visit.   Allergies  Allergen Reactions  . Amoxicillin     REACTION: Rash/shortness of breath  . Codeine     REACTION: iritability  . Penicillins    History   Social History  . Marital Status: Married    Spouse Name: N/A  . Number of Children: 2  . Years of Education: 12   Occupational History  .  [place] History Main Topics  . Smoking status: Never Smoker   . Smokeless tobacco: Never Used  . Alcohol Use: No  . Drug Use: No  . Sexual Activity: Not on file   Other Topics Concern  . Not on file   Social History Narrative   Patient lives at home with his wife . 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 He denies smoking, drinking.      Review of Systems  All other systems reviewed and are negative.      Objective:   Physical Exam  Constitutional: He appears well-developed and well-nourished.  Neck: Neck supple. No JVD present. No thyromegaly present.  Cardiovascular: Normal rate, regular rhythm and normal heart sounds.   No murmur heard. Pulmonary/Chest: Effort normal and breath sounds normal. No respiratory distress. He has no wheezes. He has no rales.  Abdominal: Soft. Bowel sounds are normal. He exhibits no distension. There is no tenderness. There is no rebound and no guarding.  Genitourinary: Rectum normal and prostate normal.  Musculoskeletal: He exhibits no edema.  Lymphadenopathy:    He has no cervical adenopathy.  Vitals reviewed.  Assessment & Plan:  HLD (hyperlipidemia) - Plan: COMPLETE METABOLIC PANEL WITH GFR, Lipid panel  Prostate cancer screening - Plan: PSA  Hand joint pain, unspecified laterality - Plan: diclofenac sodium (VOLTAREN) 1 % GEL  I will check a fasting lipid panel. Goal LDL cholesterol is less than 130. Blood pressure is excellent. Prostate exam  is normal. I will check a PSA. I also gave the patient a prescription for Voltaren gel, 2 g 4 times a day topically applied to the joints on his hands as needed

## 2014-04-18 ENCOUNTER — Encounter: Payer: Self-pay | Admitting: Family Medicine

## 2014-04-18 LAB — PSA: PSA: 3.36 ng/mL (ref <=4.00)

## 2014-04-30 ENCOUNTER — Other Ambulatory Visit: Payer: Self-pay | Admitting: Family Medicine

## 2014-05-01 NOTE — Telephone Encounter (Signed)
Medication refilled per protocol. 

## 2014-05-06 ENCOUNTER — Encounter: Payer: Self-pay | Admitting: Family Medicine

## 2014-05-31 ENCOUNTER — Other Ambulatory Visit: Payer: Self-pay | Admitting: Family Medicine
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
## 2014-12-24 ENCOUNTER — Other Ambulatory Visit: Payer: Self-pay | Admitting: Family Medicine

## 2015-03-03 ENCOUNTER — Other Ambulatory Visit: Payer: Self-pay | Admitting: Family Medicine

## 2015-03-03 NOTE — Telephone Encounter (Signed)
Refill appropriate and filled per protocol. 

## 2015-04-01 ENCOUNTER — Other Ambulatory Visit: Payer: Self-pay | Admitting: Family Medicine

## 2015-04-29 ENCOUNTER — Ambulatory Visit (INDEPENDENT_AMBULATORY_CARE_PROVIDER_SITE_OTHER): Payer: BC Managed Care – PPO | Admitting: Family Medicine

## 2015-04-29 ENCOUNTER — Encounter: Payer: Self-pay | Admitting: Family Medicine

## 2015-04-29 VITALS — BP 136/74 | HR 70 | Temp 98.6°F | Resp 16 | Ht 67.0 in | Wt 168.0 lb

## 2015-04-29 DIAGNOSIS — R0982 Postnasal drip: Secondary | ICD-10-CM | POA: Diagnosis not present

## 2015-04-29 DIAGNOSIS — J302 Other seasonal allergic rhinitis: Secondary | ICD-10-CM | POA: Diagnosis not present

## 2015-04-29 MED ORDER — CETIRIZINE HCL 10 MG PO TABS: 10.0000 mg | ORAL_TABLET | Freq: Every day | ORAL | Status: DC

## 2015-04-29 MED ORDER — BENZONATATE 100 MG PO CAPS: 100.0000 mg | ORAL_CAPSULE | Freq: Three times a day (TID) | ORAL | Status: DC | PRN

## 2015-04-29 NOTE — Progress Notes (Signed)
Patient ID: Nathaniel Porter, male   DOB: 09/18/1960, 55 y.o.   MRN: [id number]    Subjective:    Patient ID: Nathaniel Porter, male    DOB: 1960-06-09, 55 y.o.   MRN: [id number]  Patient presents for Non-Productive Cough  Patient here with dry itchy cough and throat for the past 2 weeks. Feels like he has a mucous plug he states with pollinated in his throat. He has not had any fever no sinus drainage no pressure has not felt sick. He has been coughing is worse at night when he gets the mucus. He's been taking DayQuil and NyQuil and throat lozenges.   Review Of Systems:  GEN- denies fatigue, fever, weight loss,weakness, recent illness HEENT- denies eye drainage, change in vision, nasal discharge, CVS- denies chest pain, palpitations RESP- denies SOB, +cough, wheeze Neuro- denies headache, dizziness, syncope, seizure activity       Objective:    BP 136/74 mmHg  Pulse 70  Temp(Src) 98.6 F (37 C) (Oral)  Resp 16  Ht 5\' 7"  (1.702 m)  Wt 168 lb (76.204 kg)  BMI 26.31 kg/m2 GEN- NAD, alert and oriented x3 HEENT- PERRL, EOMI, non injected sclera, pink conjunctiva, MMM, oropharynx mild injection, TM clear bilat no effusion,  No maxillary sinus tenderness,,  Clear Nasal drainage  Neck- Supple, no LAD CVS- RRR, no murmur RESP-CTAB Pulses- Radial 2+          Assessment & Plan:      Problem List Items Addressed This Visit    None    Visit Diagnoses    Seasonal allergic rhinitis    -  Primary    I think his cough is due to post nasal drip from pollen exposure, more irritatnt, no sign of infection, given Zyrtec, Tessalon for cough     Post-nasal drip           Note: This dictation was prepared with Dragon dictation along with smaller phrase technology. Any transcriptional errors that result from this process are unintentional.

## 2015-04-29 NOTE — Patient Instructions (Signed)
Take zyrtec once a day Take tessalon for cough Stop the nyquil F/u as needed

## 2015-07-06 ENCOUNTER — Encounter: Payer: Self-pay | Admitting: Physician Assistant

## 2015-07-06 ENCOUNTER — Ambulatory Visit (INDEPENDENT_AMBULATORY_CARE_PROVIDER_SITE_OTHER): Payer: BC Managed Care – PPO | Admitting: Physician Assistant

## 2015-07-06 VITALS — BP 134/90 | HR 76 | Temp 97.6°F | Resp 18 | Wt 168.0 lb

## 2015-07-06 DIAGNOSIS — B9689 Other specified bacterial agents as the cause of diseases classified elsewhere: Principal | ICD-10-CM

## 2015-07-06 DIAGNOSIS — J988 Other specified respiratory disorders: Secondary | ICD-10-CM | POA: Diagnosis not present

## 2015-07-06 MED ORDER — AZITHROMYCIN 250 MG PO TABS: ORAL_TABLET | ORAL | Status: DC

## 2015-07-06 NOTE — Progress Notes (Signed)
Patient ID: Nathaniel Porter MRN: [id number], DOB: 07-06-1960, 55 y.o. Date of Encounter: 07/06/2015, 10:16 AM    Chief Complaint:  Chief Complaint  Patient presents with  . sore throat since April    was seen April, has never fully resolved, by end of day will loose voice.     HPI: 55 y.o. year old white male presents wit above.hite male presents wit above.   I reviewed OV note with Dr. Buelah Manis 04/29/2015.  At that visit he was having cough and feeling of mucus plug in throat.  At that visit was felt to be postnasal drip/ allergic.   Today he says he is having hoarseness in morning and evenings. Has cough--occasionally productive of phlegm.  No mucus form nose. Does not feel congested in head/nose.   Worked with school system.  Even when took Zyrtec at night, felt drowsy next day so did not take this during school year.  Now that school is out and pt not having to be as alert/high functioning, he has returned to taking the Zyrtec at night.      Home Meds:   Outpatient Prescriptions Prior to Visit  Medication Sig Dispense Refill  . atorvastatin (LIPITOR) 80 MG tablet TAKE 1 TABLET BY MOUTH EVERY DAY 30 tablet 5  . cetirizine (ZYRTEC) 10 MG tablet Take 1 tablet (10 mg total) by mouth daily. 30 tablet 2  . [facility] OMEPRAZOLE 20 MG TBEC TAKE 1 TABLET (20 MG TOTAL) BY MOUTH DAILY. 28 tablet 12  . fish oil-omega-3 fatty acids 1000 MG capsule Take 1 g by mouth daily.    . benzonatate (TESSALON) 100 MG capsule Take 1 capsule (100 mg total) by mouth 3 (three) times daily as needed for cough. (Patient not taking: Reported on 07/06/2015) 20 capsule 0  . diclofenac sodium (VOLTAREN) 1 % GEL Apply 2 g topically 4 (four) times daily. (Patient not taking: Reported on 04/29/2015) 100 g 5  . carbamide peroxide (DEBROX) 6.5 % otic solution Place 5 drops into the right ear 2 (two) times daily. (Patient not taking: Reported on 04/29/2015) 15 mL 2   No facility-administered medications prior to visit.    Allergies:  Allergies    Allergen Reactions  . Amoxicillin     REACTION: Rash/shortness of breath  . Codeine     REACTION: iritability  . Oxycodone-Acetaminophen      Can not take 5-500mg  causes chest tightness and pain   . Penicillins       Review of Systems: See HPI for pertinent ROS. All other ROS negative.    Physical Exam: Blood pressure 134/90, pulse 76, temperature 97.6 F (36.4 C), temperature source Oral, resp. rate 18, weight 168 lb (76.204 kg)., Body mass index is 26.31 kg/(m^2). General:  WNWD WM. Appears in no acute distress. HEENT: Normocephalic, atraumatic, eyes without discharge, sclera non-icteric, nares are without discharge. Bilateral auditory canals with cerumen obstructing canals. Oral cavity moist, posterior pharynx without exudate, erythema, peritonsillar abscess.  Neck: Supple. No thyromegaly. No lymphadenopathy. Lungs: Clear bilaterally to auscultation without wheezes, rales, or rhonchi. Breathing is unlabored. Heart: Regular rhythm. No murmurs, rubs, or gallops. Msk:  Strength and tone normal for age. Extremities/Skin: Warm and dry.  Neuro: Alert and oriented X 3. Moves all extremities spontaneously. Gait is normal. CNII-XII grossly in tact. Psych:  Responds to questions appropriately with a normal affect.     ASSESSMENT AND PLAN:  55 y.o. year old male witho. year old male with  1. Bacterial respiratory infection Will use antibiotic to treat any  infection.  Told him to take Zytec each night, as well, to dry up drainage.  If symptoms do not resolve within one week after completion of abx, go ahead and f/u at that time. - azithromycin (ZITHROMAX) 250 MG tablet; Day 1: Take 2 daily. Days 2-5: Take 1 daily.  Dispense: 6 tablet; Refill: 0   Signed, [address] [place], [place], [facility] 07/06/2015 10:16 AM

## 2016-01-07 ENCOUNTER — Other Ambulatory Visit: Payer: Self-pay | Admitting: Family Medicine

## 2016-02-26 ENCOUNTER — Telehealth: Payer: Self-pay | Admitting: Family Medicine

## 2016-02-26 MED ORDER — OSELTAMIVIR PHOSPHATE 75 MG PO CAPS: 75.0000 mg | ORAL_CAPSULE | Freq: Two times a day (BID) | ORAL | 0 refills | Status: DC

## 2016-02-26 NOTE — Telephone Encounter (Signed)
Medication called/sent to requested pharmacy  

## 2016-02-26 NOTE — Telephone Encounter (Signed)
Pt states he is having flu like sx. Body aches, fever 100.6, cough, sore throat. Wants to know if tamiflu can be called into [facility] [address].

## 2016-03-07 ENCOUNTER — Other Ambulatory Visit: Payer: Self-pay | Admitting: Physician Assistant

## 2016-03-07 NOTE — Telephone Encounter (Signed)
Refill appropriate 

## 2016-05-11 ENCOUNTER — Other Ambulatory Visit: Payer: Self-pay | Admitting: Family Medicine

## 2016-07-06 ENCOUNTER — Encounter: Payer: Self-pay | Admitting: Physician Assistant

## 2016-07-06 ENCOUNTER — Ambulatory Visit (INDEPENDENT_AMBULATORY_CARE_PROVIDER_SITE_OTHER): Payer: BC Managed Care – PPO | Admitting: Physician Assistant

## 2016-07-06 VITALS — BP 132/84 | HR 86 | Temp 98.1°F | Resp 16 | Ht 67.0 in | Wt 169.6 lb

## 2016-07-06 DIAGNOSIS — T63441A Toxic effect of venom of bees, accidental (unintentional), initial encounter: Secondary | ICD-10-CM

## 2016-07-06 NOTE — Progress Notes (Signed)
Patient ID: Nathaniel Porter MRN: [id number], DOB: Apr 16, 1960, 56 y.o. Date of Encounter: 07/06/2016, 11:35 AM    Chief Complaint:  Chief Complaint  Patient presents with  . allergic reaction to wasp sting    sting happened monday      HPI: 56 y.o. year old male presents with above.   His entire head is bald. On left scalp on top left of head at about the area where normal hairline would be can see site of bee sting there is pink dot there. Very minimal/no swelling is apparent on inspection to me. However he states that his wife felt that he should come in and get this checked. States that on the first day he applied some Benadryl gel. Last night took a dose of oral Benadryl. This is the only treatments that he has used. Says that he was feeling the swelling go down towards his left forehead and he felt twinge of pain so he and his wife thought he should come get this checked. He has noticed no other symptoms related to this. Never had any shortness of breath or difficulty breathing and never felt any itching or swelling in his throat or lips.  He also has an old expired bottle from prior prescription for antibiotic eardrops. Says that he doesn't know if he needs a refill on that medicine or what is going on.  Says that he also uses drops for ear wax.  Says that his right ear has just been feeling a little discomfort lately feels like something is going on in his ear wanted to have that checked while he is here.  No other concerns to address.     Home Meds:   Outpatient Medications Prior to Visit  Medication Sig Dispense Refill  . atorvastatin (LIPITOR) 80 MG tablet TAKE 1 TABLET BY MOUTH EVERY DAY 30 tablet 3  . fish oil-omega-3 fatty acids 1000 MG capsule Take 1 g by mouth daily.    Marland Kitchen omeprazole (PRILOSEC) 20 MG capsule TAKE ONE CAPSULE BY MOUTH EVERY DAY 30 capsule 11  . azithromycin (ZITHROMAX) 250 MG tablet Day 1: Take 2 daily. Days 2-5: Take 1 daily. 6 tablet 0  .  benzonatate (TESSALON) 100 MG capsule Take 1 capsule (100 mg total) by mouth 3 (three) times daily as needed for cough. (Patient not taking: Reported on 07/06/2015) 20 capsule 0  . cetirizine (ZYRTEC) 10 MG tablet Take 1 tablet (10 mg total) by mouth daily. 30 tablet 2  . [facility] OMEPRAZOLE 20 MG TBEC TAKE 1 TABLET (20 MG TOTAL) BY MOUTH DAILY. 28 tablet 12  . diclofenac sodium (VOLTAREN) 1 % GEL Apply 2 g topically 4 (four) times daily. (Patient not taking: Reported on 04/29/2015) 100 g 5  . oseltamivir (TAMIFLU) 75 MG capsule Take 1 capsule (75 mg total) by mouth 2 (two) times daily. 10 capsule 0   No facility-administered medications prior to visit.     Allergies:  Allergies  Allergen Reactions  . Amoxicillin     REACTION: Rash/shortness of breath  . Codeine     REACTION: iritability  . Oxycodone-Acetaminophen      Can not take 5-500mg  causes chest tightness and pain   . Penicillins       Review of Systems: See HPI for pertinent ROS. All other ROS negative.    Physical Exam: Blood pressure 132/84, pulse 86, temperature 98.1 F (36.7 C), temperature source Oral, resp. rate 16, height 5\' 7"  (1.702 m), weight 169 lb  9.6 oz (76.9 kg), SpO2 97 %., Body mass index is 26.56 kg/m. General: WNWD WM.  Appears in no acute distress. HEENT: Normocephalic, atraumatic, eyes without discharge, sclera non-icteric, nares are without discharge. Bilateral auditory canals  Are ~ 90 % blocked with cerumen. Throat and posterior pharynx look normal. Neck: Supple. No thyromegaly. No lymphadenopathy. Lungs: Clear bilaterally to auscultation without wheezes, rales, or rhonchi. Breathing is unlabored. Heart: Regular rhythm. No murmurs, rubs, or gallops. Msk:  Strength and tone normal for age. Skin: Scalp on left front at about the area where normal hairline would be for just further slightly further back--- there is a small pink dot which is the site of his recent bee sting. I have thoroughly inspected his head  and I see no significant swelling at all. There is no erythema. All of that skin looks normal. Neuro: Alert and oriented X 3. Moves all extremities spontaneously. Gait is normal. CNII-XII grossly in tact. Psych:  Responds to questions appropriately with a normal affect.     ASSESSMENT AND PLAN:  56 y.o. year old male with  1. Bee sting, accidental or unintentional, initial encounter Have reassured him. I really do not think any further treatment is necessary but told him that he can continue some oral Benadryl if he is feeling sensation of any residual swelling present.  I have also told him that he has significant cerumen in both ears. Recommended that we irrigate them here-- as he has already been using over-the-counter drops. He states that he has equipment to use at home to irrigate them himself and prefers to do this. Told him to follow-up if needed. Told him that he does not need any antibiotic eardrops at this time.   Signed, [address] [place], [place], [facility] 07/06/2016 11:35 AM

## 2016-08-09 ENCOUNTER — Encounter: Payer: Self-pay | Admitting: Family Medicine

## 2016-08-09 ENCOUNTER — Ambulatory Visit (INDEPENDENT_AMBULATORY_CARE_PROVIDER_SITE_OTHER): Payer: BC Managed Care – PPO | Admitting: Family Medicine

## 2016-08-09 VITALS — BP 140/92 | HR 84 | Temp 98.4°F | Resp 14 | Ht 67.0 in | Wt 166.0 lb

## 2016-08-09 DIAGNOSIS — H66001 Acute suppurative otitis media without spontaneous rupture of ear drum, right ear: Secondary | ICD-10-CM

## 2016-08-09 MED ORDER — AZITHROMYCIN 250 MG PO TABS: ORAL_TABLET | ORAL | 0 refills | Status: DC

## 2016-08-09 MED ORDER — AZITHROMYCIN 250 MG PO TABS
ORAL_TABLET | ORAL | 0 refills | Status: DC
Start: 2016-08-09 — End: 2016-10-27

## 2016-08-09 NOTE — Progress Notes (Signed)
   Subjective:    Patient ID: Nathaniel Porter, male    DOB: 30-May-1960, 56 y.o.   MRN: [id number]  HPI  Reports 2 days of severe pain in his right ear. On examination, the patient has a cerumen impaction occluding his entire auditory canal. This was removed with irrigation. Tympanic membrane was revealed to be bulging, erythematous,  with a middle ear effusion.. Past Medical History:  Diagnosis Date  . Barrett esophagus   . BCC (basal cell carcinoma)   . Cerebral cyst   . Chronic otitis media of right ear   . GERD (gastroesophageal reflux disease)   . H/O hematuria   . 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 Hiatal hernia   . High cholesterol   . Hx MRSA infection    nose  . Syncope    Past Surgical History:  Procedure Laterality Date  . HERNIA REPAIR Right 01/2008  . MOLE SURGERY  2014   basil cell  . NOSE SURGERY     x 3-4   Current Outpatient Prescriptions on File Prior to Visit  Medication Sig Dispense Refill  . atorvastatin (LIPITOR) 80 MG tablet TAKE 1 TABLET BY MOUTH EVERY DAY 30 tablet 3  . fish oil-omega-3 fatty acids 1000 MG capsule Take 1 g by mouth daily.    Marland Kitchen omeprazole (PRILOSEC) 20 MG capsule TAKE ONE CAPSULE BY MOUTH EVERY DAY 30 capsule 11   No current facility-administered medications on file prior to visit.    No results for input(s): ABORH in the last 168 hours.  Social History   Social History  . Marital status: Married    Spouse name: N/A  . Number of children: 2  . Years of education: 12   Occupational History  .  [place] History Main Topics  . Smoking status: Never Smoker  . Smokeless tobacco: Never Used  . Alcohol use No  . Drug use: No  . Sexual activity: Not on file   Other Topics Concern  . Not on file   Social History Narrative   Patient lives at home with his wife . 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 He denies smoking, drinking.     Review of  Systems  All other systems reviewed and are negative.      Objective:   Physical Exam  HENT:  Right Ear: Tympanic membrane is injected, erythematous and bulging.  Mouth/Throat: Oropharynx is clear and moist. No oropharyngeal exudate.  Cardiovascular: Normal rate and regular rhythm.   Pulmonary/Chest: Effort normal and breath sounds normal.  Lymphadenopathy:    He has no cervical adenopathy.  Vitals reviewed.         Assessment & Plan:  Patient has otitis media in the right ear. He is penicillin allergic. Therefore I will treat the patient with a Z-Pak, 500 mg day one, 250 mg daily on 235. Consider a cephalosporin if symptoms persist

## 2016-09-04 ENCOUNTER — Other Ambulatory Visit: Payer: Self-pay | Admitting: Family Medicine

## 2016-10-11 ENCOUNTER — Other Ambulatory Visit: Payer: Self-pay | Admitting: Family Medicine

## 2016-10-27 ENCOUNTER — Ambulatory Visit (INDEPENDENT_AMBULATORY_CARE_PROVIDER_SITE_OTHER): Payer: BC Managed Care – PPO | Admitting: Physician Assistant

## 2016-10-27 ENCOUNTER — Encounter: Payer: Self-pay | Admitting: Physician Assistant

## 2016-10-27 VITALS — BP 138/68 | HR 82 | Temp 98.4°F | Resp 16 | Ht 67.0 in | Wt 166.0 lb

## 2016-10-27 DIAGNOSIS — J029 Acute pharyngitis, unspecified: Secondary | ICD-10-CM | POA: Diagnosis not present

## 2016-10-27 MED ORDER — AZITHROMYCIN 250 MG PO TABS: ORAL_TABLET | ORAL | 0 refills | Status: DC

## 2016-10-27 NOTE — Progress Notes (Signed)
Patient ID: Nathaniel Porter MRN: [id number], DOB: Jul 20, 1960, 56 y.o. Date of Encounter: 10/27/2016, 11:24 AM    Chief Complaint:  Chief Complaint  Patient presents with  . Illness    x3 weeks- sore throat, hoarse     HPI: 56 y.o. year old male presents with above.   States that this is been going on for about 3 weeks now. Says when he wakes up in the morning he is hoarse then it will get a little better through the day but then returns at night. Says his throat has also been kind of sore. Says he has had a little bit of cough. Very little nasal congestion or drainage from the nose. 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 Has had no fever. Has used multiple over-the-counter medicines but not resolving.     Home Meds:   Outpatient Medications Prior to Visit  Medication Sig Dispense Refill  . atorvastatin (LIPITOR) 80 MG tablet Take 1 tablet (80 mg total) by mouth daily. Needs office visit and labs before further refills 30 tablet 0  . fish oil-omega-3 fatty acids 1000 MG capsule Take 1 g by mouth daily.    Marland Kitchen omeprazole (PRILOSEC) 20 MG capsule TAKE ONE CAPSULE BY MOUTH EVERY DAY 30 capsule 11  . azithromycin (ZITHROMAX) 250 MG tablet 2 tabs poqday1, 1 tab poqday 2-5 (Patient not taking: Reported on 10/27/2016) 6 tablet 0   No facility-administered medications prior to visit.     Allergies:  Allergies  Allergen Reactions  . Amoxicillin     REACTION: Rash/shortness of breath  . Codeine     REACTION: iritability  . Oxycodone-Acetaminophen      Can not take 5-500mg  causes chest tightness and pain   . Penicillins       Review of Systems: See HPI for pertinent ROS. All other ROS negative.    Physical Exam: Blood pressure 138/68, pulse 82, temperature 98.4 F (36.9 C), temperature source Oral, resp. rate 16, height 5\' 7"  (1.702 m), weight 75.3 kg (166 lb), SpO2 98 %., Body mass index is 26 kg/m. General:  WNWD AM. Appears in  no acute distress. HEENT: Normocephalic, atraumatic, eyes without discharge, sclera non-icteric, nares are without discharge. Bilateral auditory canals clear, TM's are without perforation, pearly grey and translucent with reflective cone of light bilaterally. Oral cavity moist, posterior pharynx with mild erythema. No exudate, no peritonsillar abscess.  Neck: Supple. No thyromegaly. No lymphadenopathy. Lungs: Clear bilaterally to auscultation without wheezes, rales, or rhonchi. Breathing is unlabored. Heart: Regular rhythm. No murmurs, rubs, or gallops. Msk:  Strength and tone normal for age. Extremities/Skin: Warm and dry. Neuro: Alert and oriented X 3. Moves all extremities spontaneously. Gait is normal. CNII-XII grossly in tact. Psych:  Responds to questions appropriately with a normal affect.   Results for orders placed or performed in visit on 10/27/16  STREP GROUP A AG, W/REFLEX TO CULT  Result Value Ref Range   Streptococcus, Group A Screen (Direct) NONE DETECTED      ASSESSMENT AND PLAN:  56 y.o. year old male with  1. Sore throat - STREP GROUP A AG, W/REFLEX TO CULT  2. Acute pharyngitis, unspecified etiology Allergy to amoxicillin and penicillin. Even though strep test is negative will go ahead and dose the azithromycin to cover for strep. Is to take antibiotic as directed. If symptoms do not resolve after completion of antibiotics then follow-up for further evaluation. - azithromycin (ZITHROMAX)  250 MG tablet; Take 2 daily for 5 days.  Dispense: 10 tablet; Refill: 0   Signed, [address] [place], [place], [facility] 10/27/2016 11:24 AM

## 2016-10-28 LAB — CULTURE, GROUP A STREP
MICRO NUMBER: 81134597
SPECIMEN QUALITY:: ADEQUATE

## 2016-10-28 LAB — STREP GROUP A AG, W/REFLEX TO CULT: Streptococcus, Group A Screen (Direct): NOT DETECTED

## 2016-11-08 ENCOUNTER — Other Ambulatory Visit: Payer: Self-pay | Admitting: Family Medicine

## 2016-12-28 ENCOUNTER — Other Ambulatory Visit: Payer: Self-pay | Admitting: Family Medicine

## 2017-01-09 ENCOUNTER — Encounter: Payer: Self-pay | Admitting: Family Medicine

## 2017-01-09 ENCOUNTER — Ambulatory Visit: Payer: BC Managed Care – PPO | Admitting: Family Medicine

## 2017-01-09 VITALS — BP 132/90 | HR 76 | Temp 98.3°F | Resp 12 | Ht 67.0 in | Wt 166.0 lb

## 2017-01-09 DIAGNOSIS — E7800 Pure hypercholesterolemia, unspecified: Secondary | ICD-10-CM | POA: Diagnosis not present

## 2017-01-09 DIAGNOSIS — Z125 Encounter for screening for malignant neoplasm of prostate: Secondary | ICD-10-CM | POA: Diagnosis not present

## 2017-01-09 NOTE — Progress Notes (Signed)
Subjective:    Patient ID: Nathaniel Porter, male    DOB: 1961-01-17, 56 y.o.   MRN: [id number]  HPI Patient is a very pleasant 56 year old Caucasian male with a history of hyperlipidemia here today to recheck his cholesterol. However his blood pressure today is borderline. Reviewing his records from his last several visits, his blood pressure is been borderline the last 5 times. It is not yet severe enough to want medication. He is exercising and he tries to avoid sodium in his diet. Blood pressure does run in his family as his brother has high blood pressure. He is also complaining of muscle aches in both arms. It occurs every morning and also at night when he stridor rest. He denies any angina. He denies any shortness of breath. Otherwise he is doing well. Flu shot is up-to-date. He is due for PSA for prostate cancer screening Past Medical History:  Diagnosis Date  . Barrett esophagus   . BCC (basal cell carcinoma)   . Cerebral cyst   . Chronic otitis media of right ear   . GERD (gastroesophageal reflux disease)   . H/O hematuria   . 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 Hiatal hernia   . High cholesterol   . Hx MRSA infection    nose  . Syncope    Past Surgical History:  Procedure Laterality Date  . HERNIA REPAIR Right 01/2008  . MOLE SURGERY  2014   basil cell  . NOSE SURGERY     x 3-4   Current Outpatient Medications on File Prior to Visit  Medication Sig Dispense Refill  . atorvastatin (LIPITOR) 80 MG tablet TAKE 1 TABLET BY MOUTH DAILY. NEEDS OFFICE VISIT AND LABS BEFORE FURTHER REFILLS 30 tablet 0  . fish oil-omega-3 fatty acids 1000 MG capsule Take 1 g by mouth daily.    Marland Kitchen omeprazole (PRILOSEC) 20 MG capsule TAKE ONE CAPSULE BY MOUTH EVERY DAY 30 capsule 11   No current facility-administered medications on file prior to visit.    Allergies  Allergen Reactions  . Amoxicillin     REACTION: Rash/shortness of breath  . Codeine     REACTION: iritability  . Oxycodone-Acetaminophen       Can not take 5-500mg  causes chest tightness and pain   . Penicillins    Social History   Socioeconomic History  . Marital status: Married    Spouse name: Not on file  . Number of children: 2  . Years of education: 67  . Highest education level: Not on file  Social Needs  . Financial resource strain: Not on file  . Food insecurity - worry: Not on file  . Food insecurity - inability: Not on file  . Transportation needs - medical: Not on file  . Transportation needs - non-medical: Not on file  Occupational History    Employer: [employer]  Tobacco Use  . Smoking status: Never Smoker  . Smokeless tobacco: Never Used  Substance and Sexual Activity  . Alcohol use: No  . Drug use: No  . Sexual activity: Not on file  Other Topics Concern  . Not on file  Social History Narrative   Patient lives at home with his wife . 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 He denies smoking, drinking.      Review of Systems  All other systems reviewed and are negative.      Objective:   Physical Exam  Constitutional:  He appears well-developed and well-nourished.  Cardiovascular: Normal rate, regular rhythm and normal heart sounds.  Pulmonary/Chest: Effort normal and breath sounds normal. No respiratory distress. He has no wheezes. He has no rales.  Abdominal: Soft. Bowel sounds are normal.  Musculoskeletal: He exhibits no edema.  Vitals reviewed.         Assessment & Plan:  Pure hypercholesterolemia - Plan: COMPLETE METABOLIC PANEL WITH GFR, Lipid panel  Prostate cancer screening - Plan: PSA  Patient's blood pressures borderline. I've asked the patient start checking his blood pressure regularly at home. If consistently greater than 140/90, I would recommend medication to reduce his blood pressure. His flu shot is up-to-date. Colonoscopy is up-to-date. I will check a PSA for prostate cancer screening. I  will also check a fasting lipid panel. His goal LDL cholesterol is less than 130. If his cholesterol is excellent, may have the patient discontinue his statin for 2-3 weeks to see if the muscle aches in his arms will improve as I suspect that he may have statin-induced myopathy causing his muscle aches. Another possibility would be subacromial bursitis. PMR would be less likely

## 2017-01-14 LAB — COMPLETE METABOLIC PANEL WITH GFR
AG Ratio: 2 (calc) (ref 1.0–2.5)
ALT: 32 U/L (ref 9–46)
AST: 20 U/L (ref 10–35)
Albumin: 4.3 g/dL (ref 3.6–5.1)
Alkaline phosphatase (APISO): 86 U/L (ref 40–115)
BUN: 13 mg/dL (ref 7–25)
CALCIUM: 8.9 mg/dL (ref 8.6–10.3)
CO2: 27 mmol/L (ref 20–32)
CREATININE: 1 mg/dL (ref 0.70–1.33)
Chloride: 104 mmol/L (ref 98–110)
GFR, EST AFRICAN AMERICAN: 96 mL/min/{1.73_m2} (ref >=60)
GFR, EST NON AFRICAN AMERICAN: 83 mL/min/{1.73_m2} (ref >=60)
Globulin: 2.2 g/dL (calc) (ref 1.9–3.7)
Glucose, Bld: 124 mg/dL — ABNORMAL HIGH (ref 65–99)
Potassium: 4.6 mmol/L (ref 3.5–5.3)
Sodium: 140 mmol/L (ref 135–146)
TOTAL PROTEIN: 6.5 g/dL (ref 6.1–8.1)
Total Bilirubin: 0.7 mg/dL (ref 0.2–1.2)

## 2017-01-14 LAB — TEST AUTHORIZATION

## 2017-01-14 LAB — EXTRA LAV TOP TUBE

## 2017-01-14 LAB — LIPID PANEL
CHOL/HDL RATIO: 5.1 (calc) — AB (ref <5.0)
Cholesterol: 174 mg/dL (ref <200)
HDL: 34 mg/dL — AB (ref >40)
LDL CHOLESTEROL (CALC): 103 mg/dL — AB
NON-HDL CHOLESTEROL (CALC): 140 mg/dL — AB (ref <130)
TRIGLYCERIDES: 261 mg/dL — AB (ref <150)

## 2017-01-14 LAB — PSA: PSA: 2.8 ng/mL (ref <=4.0)

## 2017-01-14 LAB — HEMOGLOBIN A1C W/OUT EAG: HEMOGLOBIN A1C: 6 %{Hb} — AB (ref <5.7)

## 2017-01-16 ENCOUNTER — Encounter: Payer: Self-pay | Admitting: Family Medicine

## 2017-01-16 DIAGNOSIS — R7303 Prediabetes: Secondary | ICD-10-CM | POA: Insufficient documentation

## 2017-01-30 ENCOUNTER — Other Ambulatory Visit: Payer: Self-pay | Admitting: Physician Assistant

## 2017-01-30 NOTE — Telephone Encounter (Signed)
Refill appropriate 

## 2017-03-01 ENCOUNTER — Other Ambulatory Visit: Payer: Self-pay | Admitting: Physician Assistant

## 2017-04-02 ENCOUNTER — Other Ambulatory Visit: Payer: Self-pay | Admitting: Physician Assistant

## 2017-04-03 NOTE — Telephone Encounter (Signed)
Refill appropriate 

## 2017-04-27 ENCOUNTER — Ambulatory Visit: Payer: BC Managed Care – PPO | Admitting: Family Medicine

## 2017-04-27 ENCOUNTER — Other Ambulatory Visit: Payer: Self-pay

## 2017-04-27 ENCOUNTER — Encounter: Payer: Self-pay | Admitting: Family Medicine

## 2017-04-27 VITALS — BP 144/86 | HR 62 | Temp 98.0°F | Resp 14 | Ht 67.0 in | Wt 164.0 lb

## 2017-04-27 DIAGNOSIS — L03012 Cellulitis of left finger: Secondary | ICD-10-CM

## 2017-04-27 DIAGNOSIS — I10 Essential (primary) hypertension: Secondary | ICD-10-CM

## 2017-04-27 MED ORDER — SULFAMETHOXAZOLE-TRIMETHOPRIM 800-160 MG PO TABS: 1.0000 | ORAL_TABLET | Freq: Two times a day (BID) | ORAL | 0 refills | Status: DC

## 2017-04-27 MED ORDER — LISINOPRIL 10 MG PO TABS: 10.0000 mg | ORAL_TABLET | Freq: Every day | ORAL | 5 refills | Status: DC

## 2017-04-27 NOTE — Progress Notes (Signed)
Subjective:    Patient ID: Nathaniel Porter, male    DOB: [birth year], 57 y.o.   MRN: [id number]  HPI Yesterday, cut dorsal surface of left 5th dip joint while changing oil filter on lawnmower.  Last tetanus was 7 years ago.  Skin now tender warm and erythematous.  Bp at home is 13--145/90-95.  Past Medical History:  Diagnosis Date  . Barrett esophagus   . BCC (basal cell carcinoma)   . Cerebral cyst   . Chronic otitis media of right ear   . GERD (gastroesophageal reflux disease)   . H/O hematuria   . 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 Hiatal hernia   . High cholesterol   . Hx MRSA infection    nose  . Prediabetes   . Syncope    Past Surgical History:  Procedure Laterality Date  . HERNIA REPAIR Right 01/2008  . MOLE SURGERY  2014   basil cell  . NOSE SURGERY     x 3-4   Current Outpatient Medications on File Prior to Visit  Medication Sig Dispense Refill  . atorvastatin (LIPITOR) 80 MG tablet TAKE 1 TABLET BY MOUTH DAILY. NEEDS OFFICE VISIT AND LABS BEFORE FURTHER REFILLS 30 tablet 0  . fish oil-omega-3 fatty acids 1000 MG capsule Take 1 g by mouth daily.    . Garlic 0932 MG CAPS Take by mouth.    Marland Kitchen omeprazole (PRILOSEC) 20 MG capsule TAKE ONE CAPSULE BY MOUTH EVERY DAY (Patient taking differently: TAKE ONE CAPSULE BY MOUTH BID) 30 capsule 11  . vitamin E 100 UNIT capsule Take by mouth daily.     No current facility-administered medications on file prior to visit.    Allergies  Allergen Reactions  . Amoxicillin     REACTION: Rash/shortness of breath  . Codeine     REACTION: iritability  . Oxycodone-Acetaminophen      Can not take 5-500mg  causes chest tightness and pain   . Penicillins    Social History   Socioeconomic History  . Marital status: Married    Spouse name: Not on file  . Number of children: 2  . Years of education: 13  . Highest education level: Not on file  Occupational History    Employer: [employer] Needs  . Financial resource strain:  Not on file  . Food insecurity:    Worry: Not on file    Inability: Not on file  . Transportation needs:    Medical: Not on file    Non-medical: Not on file  Tobacco Use  . Smoking status: Never Smoker  . Smokeless tobacco: Never Used  Substance and Sexual Activity  . Alcohol use: No  . Drug use: No  . Sexual activity: Not on file  Lifestyle  . Physical activity:    Days per week: Not on file    Minutes per session: Not on file  . Stress: Not on file  Relationships  . Social connections:    Talks on phone: Not on file    Gets together: Not on file    Attends religious service: Not on file    Active member of club or organization: Not on file    Attends meetings of clubs or organizations: Not on file    Relationship status: Not on file  . Intimate partner violence:    Fear of current or ex partner: Not on file    Emotionally abused: Not on file    Physically abused: Not on  file    Forced sexual activity: Not on file  Other Topics Concern  . Not on file  Social History Narrative   Patient lives at home with his wife . 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 He denies smoking, drinking.      Review of Systems  All other systems reviewed and are negative.      Objective:   Physical Exam  Constitutional: He appears well-developed and well-nourished.  Cardiovascular: Normal rate, regular rhythm and normal heart sounds.  Pulmonary/Chest: Effort normal and breath sounds normal.  Musculoskeletal:       Left hand: He exhibits tenderness, laceration and swelling.       Hands: Vitals reviewed.   Very superficial laceration does not require sutures.       Assessment & Plan:  Cellulitis of finger of left hand - Plan: sulfamethoxazole-trimethoprim (BACTRIM DS,SEPTRA DS) 800-160 MG tablet  Benign essential HTN  Able to flex and extend dip joint.  No tendon damage.  Allow to close through secondary  intention.  Cover cellulitis with bactrim ds bid for 7 days and recheck immediately if worsening.  Treat bp with lisinopril 10 mg a day.

## 2017-04-30 ENCOUNTER — Other Ambulatory Visit: Payer: Self-pay | Admitting: Physician Assistant

## 2017-05-31 ENCOUNTER — Other Ambulatory Visit: Payer: Self-pay | Admitting: Physician Assistant

## 2017-06-30 ENCOUNTER — Other Ambulatory Visit: Payer: Self-pay | Admitting: Physician Assistant

## 2017-07-18 ENCOUNTER — Encounter: Payer: Self-pay | Admitting: Family Medicine

## 2017-07-18 ENCOUNTER — Ambulatory Visit: Payer: BC Managed Care – PPO | Admitting: Family Medicine

## 2017-07-18 VITALS — BP 142/90 | HR 86 | Temp 98.3°F | Resp 12 | Ht 67.0 in | Wt 165.0 lb

## 2017-07-18 DIAGNOSIS — R7303 Prediabetes: Secondary | ICD-10-CM | POA: Diagnosis not present

## 2017-07-18 DIAGNOSIS — M7552 Bursitis of left shoulder: Secondary | ICD-10-CM

## 2017-07-18 DIAGNOSIS — E7800 Pure hypercholesterolemia, unspecified: Secondary | ICD-10-CM

## 2017-07-18 DIAGNOSIS — I10 Essential (primary) hypertension: Secondary | ICD-10-CM | POA: Diagnosis not present

## 2017-07-18 MED ORDER — LOSARTAN POTASSIUM 50 MG PO TABS: 50.0000 mg | ORAL_TABLET | Freq: Every day | ORAL | 3 refills | Status: DC

## 2017-07-18 NOTE — Progress Notes (Signed)
Subjective:    Patient ID: Nathaniel Porter, male    DOB: 01-24-1960, 57 y.o.   MRN: [id number]  HPI Patient is here today for follow-up of his hypertension.  He is currently on lisinopril 10 mg a day.  His blood pressure at home has been averaging between 135 and 145/85-95.  The majority are slightly high.  His most recent lab work was in December where at that time he was found to have an elevated fasting blood sugar in the 120s and a hemoglobin A1c of 6.0.  He is also on Lipitor 80 mg a day for hyperlipidemia.  He denies any myalgias or right upper quadrant pain.  He denies any chest pain shortness of breath or dyspnea on exertion however he does complain of left shoulder pain for several weeks.  Pain is worse with abduction greater than 100 degrees.  It hurts at night when he tries to sleep.  It aches and throbs particularly in the subacromial space.  It radiates down into his deltoid.  He denies any neck pain.  He denies any paresthesias in his left arm.  He denies any numbness or tingling in his left arm.  On exam today he has a negative empty can sign.  He has a negative drop test.  He has some pain with Hawkins maneuver.  Physical exam is nonspecific but history is concerning for subacromial bursitis.  We discussed therapeutic options and the patient would like to try a subacromial cortisone injection  Past Medical History:  Diagnosis Date  . Barrett esophagus   . BCC (basal cell carcinoma)   . Cerebral cyst   . Chronic otitis media of right ear   . GERD (gastroesophageal reflux disease)   . H/O hematuria   . 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 Hiatal hernia   . High cholesterol   . Hx MRSA infection    nose  . Prediabetes   . Syncope    Past Surgical History:  Procedure Laterality Date  . HERNIA REPAIR Right 01/2008  . MOLE SURGERY  2014   basil cell  . NOSE SURGERY     x 3-4   Current Outpatient Medications on File Prior to Visit  Medication Sig Dispense Refill  . atorvastatin  (LIPITOR) 80 MG tablet TAKE 1 TABLET BY MOUTH DAILY. NEEDS OFFICE VISIT AND LABS BEFORE FURTHER REFILLS 30 tablet 0  . fish oil-omega-3 fatty acids 1000 MG capsule Take 1 g by mouth daily.    . Garlic 4315 MG CAPS Take by mouth.    Marland Kitchen omeprazole (PRILOSEC) 20 MG capsule TAKE ONE CAPSULE BY MOUTH EVERY DAY (Patient taking differently: TAKE ONE CAPSULE BY MOUTH BID) 30 capsule 11  . vitamin E 100 UNIT capsule Take by mouth daily.     No current facility-administered medications on file prior to visit.    Allergies  Allergen Reactions  . Amoxicillin     REACTION: Rash/shortness of breath  . Codeine     REACTION: iritability  . Oxycodone-Acetaminophen      Can not take 5-500mg  causes chest tightness and pain   . Penicillins    Social History   Socioeconomic History  . Marital status: Married    Spouse name: Not on file  . Number of children: 2  . Years of education: 10  . Highest education level: Not on file  Occupational History    Employer: [employer]  . Financial resource strain: Not on file  .  Food insecurity:    Worry: Not on file    Inability: Not on file  . Transportation needs:    Medical: Not on file    Non-medical: Not on file  Tobacco Use  . Smoking status: Never Smoker  . Smokeless tobacco: Never Used  Substance and Sexual Activity  . Alcohol use: No  . Drug use: No  . Sexual activity: Not on file  Lifestyle  . Physical activity:    Days per week: Not on file    Minutes per session: Not on file  . Stress: Not on file  Relationships  . Social connections:    Talks on phone: Not on file    Gets together: Not on file    Attends religious service: Not on file    Active member of club or organization: Not on file    Attends meetings of clubs or organizations: Not on file    Relationship status: Not on file  . Intimate partner violence:    Fear of current or ex partner: Not on file    Emotionally abused: Not on file    Physically abused:  Not on file    Forced sexual activity: Not on file  Other Topics Concern  . Not on file  Social History Narrative   Patient lives at home with his wife . 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 He denies smoking, drinking.      Review of Systems  All other systems reviewed and are negative.      Objective:   Physical Exam  Constitutional: He appears well-developed and well-nourished.  Cardiovascular: Normal rate, regular rhythm and normal heart sounds.  Pulmonary/Chest: Effort normal and breath sounds normal. No stridor. No respiratory distress. He has no wheezes.  Abdominal: Soft. Bowel sounds are normal.  Musculoskeletal: He exhibits no edema.       Left shoulder: He exhibits decreased range of motion and pain. He exhibits no tenderness, no crepitus, no spasm and normal strength.  Vitals reviewed.         Assessment & Plan:  Benign essential HTN  Pure hypercholesterolemia  Prediabetes  Subacromial bursitis of left shoulder joint  Patient's blood pressure is mildly elevated.  Unfortunately is getting a dry nonproductive cough on lisinopril.  Therefore discontinue lisinopril and switch to losartan 50 mg a day and reassess blood pressure in 1 month.  I would like the patient return fasting so that we can monitor his cholesterol along with his hemoglobin A1c.  I believe based on his history is having subacromial bursitis in the left shoulder.  After obtaining informed consent, I injected the subacromial space using sterile technique with 2 cc lidocaine, 2 cc of Marcaine, and 2 cc of 40 mg/mL Kenalog.  Patient tolerated the procedure well without complication.

## 2017-07-19 ENCOUNTER — Other Ambulatory Visit: Payer: BC Managed Care – PPO

## 2017-07-20 LAB — COMPLETE METABOLIC PANEL WITH GFR
AG RATIO: 1.9 (calc) (ref 1.0–2.5)
ALT: 31 U/L (ref 9–46)
AST: 18 U/L (ref 10–35)
Albumin: 4.5 g/dL (ref 3.6–5.1)
Alkaline phosphatase (APISO): 93 U/L (ref 40–115)
BUN: 17 mg/dL (ref 7–25)
CO2: 24 mmol/L (ref 20–32)
CREATININE: 0.98 mg/dL (ref 0.70–1.33)
Calcium: 9.4 mg/dL (ref 8.6–10.3)
Chloride: 105 mmol/L (ref 98–110)
GFR, Est African American: 98 mL/min/{1.73_m2} (ref >=60)
GFR, Est Non African American: 85 mL/min/{1.73_m2} (ref >=60)
GLOBULIN: 2.4 g/dL (ref 1.9–3.7)
Glucose, Bld: 131 mg/dL — ABNORMAL HIGH (ref 65–99)
POTASSIUM: 4.5 mmol/L (ref 3.5–5.3)
SODIUM: 140 mmol/L (ref 135–146)
Total Bilirubin: 0.5 mg/dL (ref 0.2–1.2)
Total Protein: 6.9 g/dL (ref 6.1–8.1)

## 2017-07-20 LAB — HEMOGLOBIN A1C
Hgb A1c MFr Bld: 6 % of total Hgb — ABNORMAL HIGH (ref <5.7)
Mean Plasma Glucose: 126 (calc)
eAG (mmol/L): 7 (calc)

## 2017-07-20 LAB — LIPID PANEL
CHOLESTEROL: 198 mg/dL (ref <200)
HDL: 41 mg/dL (ref >40)
LDL Cholesterol (Calc): 118 mg/dL (calc) — ABNORMAL HIGH
Non-HDL Cholesterol (Calc): 157 mg/dL (calc) — ABNORMAL HIGH (ref <130)
Total CHOL/HDL Ratio: 4.8 (calc) (ref <5.0)
Triglycerides: 295 mg/dL — ABNORMAL HIGH (ref <150)

## 2017-07-21 ENCOUNTER — Encounter (INDEPENDENT_AMBULATORY_CARE_PROVIDER_SITE_OTHER): Payer: Self-pay

## 2017-07-24 ENCOUNTER — Encounter: Payer: Self-pay | Admitting: Family Medicine

## 2017-07-26 ENCOUNTER — Telehealth: Payer: Self-pay | Admitting: Family Medicine

## 2017-07-26 NOTE — Telephone Encounter (Signed)
Pt called and states that the Losartan is causing him breathing issues. He has had a few times where his breathing has been labored and hard to catch his breath without exerting himself. He called the pharmacy and it is listed as a side effect. He has taken it today but I informed him not to take tomorrow and will call back with MD's recommendations.

## 2017-07-26 NOTE — Telephone Encounter (Signed)
Patient calling to discuss some side effects he is having from his lasartan  [phone]

## 2017-07-27 MED ORDER — HYDROCHLOROTHIAZIDE 25 MG PO TABS: 25.0000 mg | ORAL_TABLET | Freq: Every day | ORAL | 3 refills | Status: DC

## 2017-07-27 NOTE — Telephone Encounter (Signed)
Stop and try hctz 25 mg poqd instead.  If no better, needs to be seen for dyspnea.

## 2017-07-27 NOTE — Telephone Encounter (Signed)
Patient aware of providers recommendations via vm and med sent to [facility]

## 2017-07-28 NOTE — Telephone Encounter (Signed)
Pt called and LMOVM stating that he read the side effect profile of the HCTZ and it states that you should not take if you are allergic to sulfa or pcn and he has had an allergic reaction to amoxicillin so he is worried about taking this medication. Please advise.

## 2017-07-28 NOTE — Telephone Encounter (Signed)
Pt called LMOVM wanting to know about BP med that he did stop it and feels better today. He left 2 numbers for a call back [facility]. Tried to call pt on both numbers and no answer - left message on cell number with recommendations and that med had been called to pharm.

## 2017-07-30 NOTE — Telephone Encounter (Signed)
It can be taken by patients allergic to amoxicillin.  If he doesn't feel comfortable, he could replace with amlodipine 10 mg a day.

## 2017-07-31 MED ORDER — AMLODIPINE BESYLATE 10 MG PO TABS: 10.0000 mg | ORAL_TABLET | Freq: Every day | ORAL | 3 refills | Status: DC

## 2017-07-31 NOTE — Telephone Encounter (Signed)
LMTRC

## 2017-07-31 NOTE — Addendum Note (Signed)
Addended by: Shary Decamp B on: 07/31/2017 03:01 PM   Modules accepted: Orders

## 2017-07-31 NOTE — Telephone Encounter (Signed)
Pt aware of recommendations and LMOVM stating that he would like to try the Amlodipine and would not like to take the HCTZ. Med sent to pharm.

## 2017-08-17 ENCOUNTER — Telehealth: Payer: Self-pay | Admitting: Family Medicine

## 2017-08-17 ENCOUNTER — Encounter: Payer: Self-pay | Admitting: Family Medicine

## 2017-08-17 ENCOUNTER — Ambulatory Visit (INDEPENDENT_AMBULATORY_CARE_PROVIDER_SITE_OTHER): Payer: BC Managed Care – PPO | Admitting: Family Medicine

## 2017-08-17 VITALS — BP 118/74 | HR 90 | Temp 98.1°F | Resp 16 | Ht 67.0 in | Wt 158.0 lb

## 2017-08-17 DIAGNOSIS — I10 Essential (primary) hypertension: Secondary | ICD-10-CM

## 2017-08-17 NOTE — Telephone Encounter (Signed)
Patient called LMOVM stating that the Amlodipine is causing him stomach cramps and chest tightness. He has been taking it for 2 weeks. (he could not tolerate Losartan either)

## 2017-08-17 NOTE — Telephone Encounter (Signed)
I would like to see him.  Those are not known side effects of that medication.

## 2017-08-17 NOTE — Telephone Encounter (Signed)
LMTRC

## 2017-08-17 NOTE — Telephone Encounter (Signed)
Pt came in office and seen by Dr. Dennard Schaumann

## 2017-08-17 NOTE — Progress Notes (Signed)
Subjective:    Patient ID: Nathaniel Porter, male    DOB: 23-Mar-1960, 57 y.o.   MRN: [id number]  HPI  07/18/17 Patient is here today for follow-up of his hypertension.  He is currently on lisinopril 10 mg a day.  His blood pressure at home has been averaging between 135 and 145/85-95.  The majority are slightly high.  His most recent lab work was in December where at that time he was found to have an elevated fasting blood sugar in the 120s and a hemoglobin A1c of 6.0.  He is also on Lipitor 80 mg a day for hyperlipidemia.  He denies any myalgias or right upper quadrant pain.  He denies any chest pain shortness of breath or dyspnea on exertion however he does complain of left shoulder pain for several weeks.  Pain is worse with abduction greater than 100 degrees.  It hurts at night when he tries to sleep.  It aches and throbs particularly in the subacromial space.  It radiates down into his deltoid.  He denies any neck pain.  He denies any paresthesias in his left arm.  He denies any numbness or tingling in his left arm.  On exam today he has a negative empty can sign.  He has a negative drop test.  He has some pain with Hawkins maneuver.  Physical exam is nonspecific but history is concerning for subacromial bursitis.  We discussed therapeutic options and the patient would like to try a subacromial cortisone injection.  At that time, my plan was: Patient's blood pressure is mildly elevated.  Unfortunately is getting a dry nonproductive cough on lisinopril.  Therefore discontinue lisinopril and switch to losartan 50 mg a day and reassess blood pressure in 1 month.  I would like the patient return fasting so that we can monitor his cholesterol along with his hemoglobin A1c.  I believe based on his history is having subacromial bursitis in the left shoulder.  After obtaining informed consent, I injected the subacromial space using sterile technique with 2 cc lidocaine, 2 cc of Marcaine, and 2 cc of 40 mg/mL  Kenalog.  Patient tolerated the procedure well without complication.  08/17/17 Patient discontinued lisinopril and started losartan.  However he complained of muscle tightness in his chest on the losartan and he wanted to discontinue the medication.  I gave the patient a prescription for hydrochlorothiazide however the patient would not take the medication after he read the potential side effects of the medication could cause.  Therefore he never started hydrochlorothiazide even though he bought an entire bottle.  Instead we started him on amlodipine 10 mg a day however he states that on the amlodipine, he feels jittery, he feels muscle tightness in his chest, he feels nausea and bloating.  I try to explain to the patient that these are not common symptoms from that medication.  I asked him a variety of questions to determine if it could be another underlying problem.  He denies any shortness of breath.  He denies any pleurisy.  He denies any dyspnea on exertion.  He denies any angina.  He states that the chest tightness does not get any worse with exercise or activity.  He denies any cough, hemoptysis, pleurisy.  He does have some indigestion and bloating.  He complains of his muscles feeling tight in his chest however this is been present on the losartan and amlodipine.  Past Medical History:  Diagnosis Date  . Barrett esophagus   .  BCC (basal cell carcinoma)   . Cerebral cyst   . Chronic otitis media of right ear   . GERD (gastroesophageal reflux disease)   . H/O hematuria   . 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 Hiatal hernia   . High cholesterol   . Hx MRSA infection    nose  . Prediabetes   . Syncope    Past Surgical History:  Procedure Laterality Date  . HERNIA REPAIR Right 01/2008  . MOLE SURGERY  2014   basil cell  . NOSE SURGERY     x 3-4   Current Outpatient Medications on File Prior to Visit  Medication Sig Dispense Refill  . amLODipine (NORVASC) 10 MG tablet Take 1 tablet (10 mg total)  by mouth daily. 90 tablet 3  . atorvastatin (LIPITOR) 80 MG tablet TAKE 1 TABLET BY MOUTH DAILY. NEEDS OFFICE VISIT AND LABS BEFORE FURTHER REFILLS 30 tablet 0  . fish oil-omega-3 fatty acids 1000 MG capsule Take 1 g by mouth daily.    . Garlic 6237 MG CAPS Take by mouth.    Marland Kitchen omeprazole (PRILOSEC) 20 MG capsule TAKE ONE CAPSULE BY MOUTH EVERY DAY (Patient taking differently: TAKE ONE CAPSULE BY MOUTH BID) 30 capsule 11  . vitamin E 100 UNIT capsule Take by mouth daily.     No current facility-administered medications on file prior to visit.    Allergies  Allergen Reactions  . Amoxicillin     REACTION: Rash/shortness of breath  . Codeine     REACTION: iritability  . Oxycodone-Acetaminophen      Can not take 5-500mg  causes chest tightness and pain   . Penicillins    Social History   Socioeconomic History  . Marital status: Married    Spouse name: Not on file  . Number of children: 2  . Years of education: 50  . Highest education level: Not on file  Occupational History    Employer: [employer]  . Financial resource strain: Not on file  . Food insecurity:    Worry: Not on file    Inability: Not on file  . Transportation needs:    Medical: Not on file    Non-medical: Not on file  Tobacco Use  . Smoking status: Never Smoker  . Smokeless tobacco: Never Used  Substance and Sexual Activity  . Alcohol use: No  . Drug use: No  . Sexual activity: Not on file  Lifestyle  . Physical activity:    Days per week: Not on file    Minutes per session: Not on file  . Stress: Not on file  Relationships  . Social connections:    Talks on phone: Not on file    Gets together: Not on file    Attends religious service: Not on file    Active member of club or organization: Not on file    Attends meetings of clubs or organizations: Not on file    Relationship status: Not on file  . Intimate partner violence:    Fear of current or ex partner: Not on file     Emotionally abused: Not on file    Physically abused: Not on file    Forced sexual activity: Not on file  Other Topics Concern  . Not on file  Social History Narrative   Patient lives at home with his wife . 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 He denies smoking, drinking.  Review of Systems  All other systems reviewed and are negative.      Objective:   Physical Exam  Constitutional: He appears well-developed and well-nourished.  Cardiovascular: Normal rate, regular rhythm and normal heart sounds.  Pulmonary/Chest: Effort normal and breath sounds normal. No stridor. No respiratory distress. He has no wheezes.  Abdominal: Soft. Bowel sounds are normal.  Musculoskeletal: He exhibits no edema.  Vitals reviewed.         Assessment & Plan:  Benign essential HTN Patient was having no symptoms until I discontinue the lisinopril.  He has now had unusual symptoms on losartan and amlodipine.  He refused to take hydrochlorothiazide due to side effects.  I discussed today with the patient that I feel some of his symptoms may be related to anxiety or fear having read the side effect profile of the medications.  After a long discussion he would like to try hydrochlorothiazide to see if this will lower his blood pressure.  If he continues to have chest discomfort, I would recommend a work-up for atypical chest pain.  His symptoms do not sound cardiac in nature however if the chest wall tightness persist on hydrochlorothiazide, I could not explain this is a medication side effect.  I suspect the most of this could be chest wall pain due to anxiety given the lack of exertional symptoms the lack of shortness of breath.

## 2017-10-20 ENCOUNTER — Ambulatory Visit: Payer: BC Managed Care – PPO | Admitting: Family Medicine

## 2017-10-20 ENCOUNTER — Encounter: Payer: Self-pay | Admitting: Family Medicine

## 2017-10-20 VITALS — BP 118/84 | HR 85 | Temp 98.3°F | Resp 16 | Ht 67.0 in | Wt 160.4 lb

## 2017-10-20 DIAGNOSIS — F430 Acute stress reaction: Secondary | ICD-10-CM | POA: Diagnosis not present

## 2017-10-20 DIAGNOSIS — Z8249 Family history of ischemic heart disease and other diseases of the circulatory system: Secondary | ICD-10-CM

## 2017-10-20 DIAGNOSIS — F411 Generalized anxiety disorder: Secondary | ICD-10-CM | POA: Diagnosis not present

## 2017-10-20 DIAGNOSIS — R0789 Other chest pain: Secondary | ICD-10-CM | POA: Diagnosis not present

## 2017-10-20 MED ORDER — ALPRAZOLAM 0.25 MG PO TABS
0.2500 mg | ORAL_TABLET | Freq: Two times a day (BID) | ORAL | 0 refills | Status: DC | PRN
Start: 2017-10-20 — End: 2018-11-30
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
## 2017-10-20 NOTE — Patient Instructions (Signed)
Nonspecific Chest Pain Chest pain can be caused by many different conditions. There is a chance that your pain could be related to something serious, such as a heart attack or a blood clot in your lungs. Chest pain can also be caused by conditions that are not life-threatening. If you have chest pain, it is very important to follow up with your doctor. Follow these instructions at home: Medicines  If you were prescribed an antibiotic medicine, take it as told by your doctor. Do not stop taking the antibiotic even if you start to feel better.  Take over-the-counter and prescription medicines only as told by your doctor. Lifestyle  Do not use any products that contain nicotine or tobacco, such as cigarettes and e-cigarettes. If you need help quitting, ask your doctor.  Do not drink alcohol.  Make lifestyle changes as told by your doctor. These may include: ? Getting regular exercise. Ask your doctor for some activities that are safe for you. ? Eating a heart-healthy diet. A diet specialist (dietitian) can help you to learn healthy eating options. ? Staying at a healthy weight. ? Managing diabetes, if needed. ? Lowering your stress, as with deep breathing or spending time in nature. General instructions  Avoid any activities that make you feel chest pain.  If your chest pain is because of heartburn: ? Raise (elevate) the head of your bed about 6 inches (15 cm). You can do this by putting blocks under the bed legs at the head of the bed. ? Do not sleep with extra pillows under your head. That does not help heartburn.  Keep all follow-up visits as told by your doctor. This is important. This includes any further testing if your chest pain does not go away. Contact a doctor if:  Your chest pain does not go away.  You have a rash with blisters on your chest.  You have a fever.  You have chills. Get help right away if:  Your chest pain is worse.  You have a cough that gets worse, or  you cough up blood.  You have very bad (severe) pain in your belly (abdomen).  You are very weak.  You pass out (faint).  You have either of these for no clear reason: ? Sudden chest discomfort. ? Sudden discomfort in your arms, back, neck, or jaw.  You have shortness of breath at any time.  You suddenly start to sweat, or your skin gets clammy.  You feel sick to your stomach (nauseous).  You throw up (vomit).  You suddenly feel light-headed or dizzy.  Your heart starts to beat fast, or it feels like it is skipping beats. These symptoms may be an emergency. Do not wait to see if the symptoms will go away. Get medical help right away. Call your local emergency services (911 in the [place] Do not drive yourself to the hospital. This information is not intended to replace advice given to you by your health care provider. Make sure you discuss any questions you have with your health care provider. Document Released: 06/22/2007 Document Revised: 09/28/2015 Document Reviewed: 09/28/2015 Elsevier Interactive Patient Education  2017 [employer].    Complicated Grieving Grief is a normal response to the death of someone close to you. Feelings of fear, anger, and guilt can affect almost everyone who loses a loved one. It is also common to have symptoms of depression while you are grieving. These include problems with sleep, loss of appetite, and lack of energy. They may  last for weeks or months after a loss. Complicated grief is different from normal grief or depression. Normal grieving involves sadness and feelings of loss, but these feelings are not constant. Complicated grief is a constant and severe type of grief. It interferes with your ability to function normally. It may last for several months to a year or longer. Complicated grief may require treatment from a mental health care provider. What are the causes? It is not known why some people continue to struggle with grief and others  do not. You may be at higher risk for complicated grief if:  The death of your loved one was sudden or unexpected.  The death of your loved one was due to a violent event.  Your loved one committed suicide.  Your loved one was a child or a young person.  You were very close to or dependent on the loved one.  You have a history of depression.  What are the signs or symptoms? Signs and symptoms of complicated grief may include:  Feeling disbelief or numbness.  Being unable to enjoy good memories of your loved one.  Needing to avoid anything that reminds you of your loved one.  Being unable to stop thinking about the death.  Feeling intense anger or guilt.  Feeling alone and hopeless.  Feeling that your life is meaningless and empty.  Losing the desire to live.  How is this diagnosed? Your health care provider may diagnose complicated grief if:  You have constant symptoms of grief for 6-12 months or longer.  Your symptoms are interfering with your ability to live your life.  Your health care provider may want you to see a mental health care provider. Many symptoms of depression are similar to the symptoms of complicated grief. It is important to be evaluated for complicated grief along with other mental health conditions. How is this treated? Talk therapy with a mental health provider is the most common treatment for complicated grief. During therapy, you will learn healthy ways to cope with the loss of your loved one. In some cases, your mental health care provider may also recommend antidepressant medicines. Follow these instructions at home:  Take care of yourself. ? Eat regular meals and maintain a healthy diet. Eat plenty of fruits, vegetables, and whole grains. ? Try to get some exercise each day. ? Keep regular hours for sleep. Try to get at least 8 hours of sleep each night.  Do not use drugs or alcohol to ease your symptoms.  Take medicines only as directed  by your health care provider.  Spend time with friends and loved ones.  Consider joining a grief (bereavement) support group to help you deal with your loss.  Keep all follow-up visits as directed by your health care provider. This is important. Contact a health care provider if:  Your symptoms keep you from functioning normally.  Your symptoms do not get better with treatment. Get help right away if:  You have serious thoughts of hurting yourself or someone else.  You have suicidal feelings. This information is not intended to replace advice given to you by your health care provider. Make sure you discuss any questions you have with your health care provider. Document Released: 01/03/2005 Document Revised: 06/11/2015 Document Reviewed: 06/13/2013 Elsevier Interactive Patient Education  2018 [employer].  Panic Attacks Panic attacks are sudden, short feelings of great fear or discomfort. You may have them for no reason when you are relaxed, when you are uneasy (  anxious), or when you are sleeping. Follow these instructions at home:  Take all your medicines as told.  Check with your doctor before starting new medicines.  Keep all doctor visits. Contact a doctor if:  You are not able to take your medicines as told.  Your symptoms do not get better.  Your symptoms get worse. Get help right away if:  Your attacks seem different than your normal attacks.  You have thoughts about hurting yourself or others.  You take panic attack medicine and you have a side effect. This information is not intended to replace advice given to you by your health care provider. Make sure you discuss any questions you have with your health care provider. Document Released: 02/05/2010 Document Revised: 06/11/2015 Document Reviewed: 08/17/2012 Elsevier Interactive Patient Education  2017 [employer].

## 2017-12-29 ENCOUNTER — Telehealth: Payer: Self-pay | Admitting: Family Medicine

## 2017-12-29 NOTE — Telephone Encounter (Signed)
Pt has been exposed to flu sx he is currently having runny nose, congestion. His spouse and son have confirmed cases. Please call in tamiflu to [facility] [address]

## 2017-12-29 NOTE — Telephone Encounter (Signed)
Note    Pt has been exposed to flu sx he is currently having runny nose, congestion. His spouse and son have confirmed cases. Please call in tamiflu to [facility] [address]

## 2017-12-29 NOTE — Telephone Encounter (Signed)
OK to send to pharm?

## 2018-01-01 NOTE — Telephone Encounter (Signed)
I called out tamiflu Friday night.  Did not get message prior to closing.

## 2018-04-02 ENCOUNTER — Encounter: Payer: Self-pay | Admitting: Family Medicine

## 2018-04-02 ENCOUNTER — Other Ambulatory Visit: Payer: Self-pay

## 2018-04-02 ENCOUNTER — Ambulatory Visit
Admission: RE | Admit: 2018-04-02 | Discharge: 2018-04-02 | Disposition: A | Discharge status: Home / Self Care | Payer: BC Managed Care – PPO | Source: Ambulatory Visit | Attending: Family Medicine | Admitting: Family Medicine

## 2018-04-02 ENCOUNTER — Ambulatory Visit: Payer: BC Managed Care – PPO | Admitting: Family Medicine

## 2018-04-02 VITALS — BP 142/90 | HR 80 | Temp 98.0°F | Resp 16 | Ht 67.0 in | Wt 166.0 lb

## 2018-04-02 DIAGNOSIS — M25511 Pain in right shoulder: Secondary | ICD-10-CM

## 2018-04-02 DIAGNOSIS — Z9109 Other allergy status, other than to drugs and biological substances: Secondary | ICD-10-CM

## 2018-04-02 DIAGNOSIS — M25512 Pain in left shoulder: Principal | ICD-10-CM

## 2018-04-02 DIAGNOSIS — G8929 Other chronic pain: Secondary | ICD-10-CM

## 2018-04-02 MED ORDER — LORATADINE 10 MG PO TABS: 10.0000 mg | ORAL_TABLET | Freq: Every day | ORAL | 3 refills | Status: DC

## 2018-04-02 MED ORDER — PREDNISONE 20 MG PO TABS: ORAL_TABLET | ORAL | 0 refills | Status: DC

## 2018-04-02 NOTE — Progress Notes (Signed)
Subjective:    Patient ID: Nathaniel Porter, male    DOB: June 20, 1960, 58 y.o.   MRN: [id number]  HPI  Symptoms began Saturday after he raked moldy leaves.  He started getting runny nose, head congestion, postnasal drip, sinus drainage, and a scratchy itchy throat.  He has been on Flonase but despite taking Flonase, it did not improve.  He has had allergies like this for years and he states that this happens every year.  He denies any fevers or chills or body aches.  He does however complain of pain in both shoulders.  I gave him a cortisone shot in his left shoulder did help for several months however now he started to ache and throb in both shoulders.  He also complains of aching and throbbing pain down to his biceps and triceps in both arms. Past Medical History:  Diagnosis Date  . Barrett esophagus   . BCC (basal cell carcinoma)   . Cerebral cyst   . Chronic otitis media of right ear   . GERD (gastroesophageal reflux disease)   . H/O hematuria   . 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 Hiatal hernia   . High cholesterol   . Hx MRSA infection    nose  . Prediabetes   . Syncope    Past Surgical History:  Procedure Laterality Date  . HERNIA REPAIR Right 01/2008  . MOLE SURGERY  2014   basil cell  . NOSE SURGERY     x 3-4   Current Outpatient Medications on File Prior to Visit  Medication Sig Dispense Refill  . ALPRAZolam (XANAX) 0.25 MG tablet Take 1 tablet (0.25 mg total) by mouth 2 (two) times daily as needed for anxiety. 10 tablet 0  . amLODipine (NORVASC) 10 MG tablet Take 1 tablet (10 mg total) by mouth daily. (Patient not taking: Reported on 10/20/2017) 90 tablet 3  . atorvastatin (LIPITOR) 80 MG tablet TAKE 1 TABLET BY MOUTH DAILY. NEEDS OFFICE VISIT AND LABS BEFORE FURTHER REFILLS 30 tablet 0  . fish oil-omega-3 fatty acids 1000 MG capsule Take 1 g by mouth daily.    . Garlic 3846 MG CAPS Take by mouth.    . hydrochlorothiazide (HYDRODIURIL) 25 MG tablet Take 25 mg by mouth daily.     Marland Kitchen omeprazole (PRILOSEC) 20 MG capsule TAKE ONE CAPSULE BY MOUTH EVERY DAY (Patient taking differently: TAKE ONE CAPSULE BY MOUTH BID) 30 capsule 11  . vitamin E 100 UNIT capsule Take by mouth daily.     No current facility-administered medications on file prior to visit.    Allergies  Allergen Reactions  . Amoxicillin     REACTION: Rash/shortness of breath  . Codeine     REACTION: iritability  . Oxycodone-Acetaminophen      Can not take 5-500mg  causes chest tightness and pain   . Penicillins    Social History   Socioeconomic History  . Marital status: Married    Spouse name: Not on file  . Number of children: 2  . Years of education: 90  . Highest education level: Not on file  Occupational History    Employer: [employer]  . Financial resource strain: Not on file  . Food insecurity:    Worry: Not on file    Inability: Not on file  . Transportation needs:    Medical: Not on file    Non-medical: Not on file  Tobacco Use  . Smoking status: Never Smoker  .  Smokeless tobacco: Never Used  Substance and Sexual Activity  . Alcohol use: No  . Drug use: No  . Sexual activity: Not on file  Lifestyle  . Physical activity:    Days per week: Not on file    Minutes per session: Not on file  . Stress: Not on file  Relationships  . Social connections:    Talks on phone: Not on file    Gets together: Not on file    Attends religious service: Not on file    Active member of club or organization: Not on file    Attends meetings of clubs or organizations: Not on file    Relationship status: Not on file  . Intimate partner violence:    Fear of current or ex partner: Not on file    Emotionally abused: Not on file    Physically abused: Not on file    Forced sexual activity: Not on file  Other Topics Concern  . Not on file  Social History Narrative   Patient lives at home with his wife . 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 He denies smoking, drinking.      Review of Systems  All other systems reviewed and are negative.      Objective:   Physical Exam Vitals signs reviewed.  Constitutional:      General: He is not in acute distress.    Appearance: Normal appearance. He is well-developed and normal weight. He is not ill-appearing, toxic-appearing or diaphoretic.  HENT:     Head: Normocephalic and atraumatic.     Right Ear: Tympanic membrane and ear canal normal. There is no impacted cerumen.     Left Ear: Tympanic membrane and ear canal normal. There is no impacted cerumen.     Nose: Nose normal. No congestion or rhinorrhea.     Mouth/Throat:     Mouth: Mucous membranes are moist.     Pharynx: No oropharyngeal exudate or posterior oropharyngeal erythema.  Eyes:     General: No scleral icterus.       Right eye: No discharge.        Left eye: No discharge.     Extraocular Movements: Extraocular movements intact.     Conjunctiva/sclera: Conjunctivae normal.     Pupils: Pupils are equal, round, and reactive to light.  Neck:     Musculoskeletal: Neck supple. No neck rigidity.     Vascular: No carotid bruit.  Cardiovascular:     Rate and Rhythm: Normal rate and regular rhythm.     Pulses: Normal pulses.     Heart sounds: Normal heart sounds. No murmur. No friction rub. No gallop.   Pulmonary:     Effort: Pulmonary effort is normal. No respiratory distress.     Breath sounds: Normal breath sounds. No stridor. No wheezing, rhonchi or rales.  Abdominal:     General: Bowel sounds are normal. There is no distension.     Palpations: Abdomen is soft. There is no mass.     Tenderness: There is no abdominal tenderness. There is no right CVA tenderness, left CVA tenderness, guarding or rebound.     Hernia: No hernia is present.  Musculoskeletal:        General: No swelling, deformity or signs of injury.     Right shoulder: He exhibits decreased range of motion and  tenderness.     Left shoulder: He exhibits decreased range of motion and tenderness.  Right lower leg: No edema.     Left lower leg: No edema.  Lymphadenopathy:     Cervical: No cervical adenopathy.  Skin:    General: Skin is warm.     Coloration: Skin is not jaundiced or pale.     Findings: No bruising, erythema, lesion or rash.  Neurological:     Mental Status: He is alert.           Assessment & Plan:  Chronic pain of both shoulders - Plan: DG Shoulder Left, DG Shoulder Right  Environmental allergies  Symptoms sound like seasonal allergies.  Patient has tried and failed Flonase.  Will use a prednisone taper pack to get control of his symptoms and then start Claritin 10 mg a day to control him long-term.  Recheck immediately if he develops fevers or signs of an infection.  Obtain x-rays of both shoulders.  Differential diagnosis is bursitis in the shoulders, arthritis in the shoulders, or possibly statin induced myopathy.  If x-rays are negative and prednisone does not help with the pain, consider temporarily discontinuing his statin.

## 2018-06-15 ENCOUNTER — Other Ambulatory Visit: Payer: Self-pay | Admitting: Family Medicine

## 2018-09-17 ENCOUNTER — Other Ambulatory Visit: Payer: Self-pay | Admitting: Family Medicine

## 2018-09-17 DIAGNOSIS — E7800 Pure hypercholesterolemia, unspecified: Secondary | ICD-10-CM

## 2018-09-17 DIAGNOSIS — R7303 Prediabetes: Secondary | ICD-10-CM

## 2018-09-17 DIAGNOSIS — I10 Essential (primary) hypertension: Secondary | ICD-10-CM

## 2018-09-19 ENCOUNTER — Other Ambulatory Visit: Payer: BC Managed Care – PPO

## 2018-09-19 ENCOUNTER — Other Ambulatory Visit: Payer: Self-pay

## 2018-09-19 DIAGNOSIS — E7800 Pure hypercholesterolemia, unspecified: Secondary | ICD-10-CM

## 2018-09-19 DIAGNOSIS — R7303 Prediabetes: Secondary | ICD-10-CM

## 2018-09-19 DIAGNOSIS — I10 Essential (primary) hypertension: Secondary | ICD-10-CM

## 2018-09-20 LAB — CBC WITH DIFFERENTIAL/PLATELET
Absolute Monocytes: 481 cells/uL (ref 200–950)
Basophils Absolute: 49 cells/uL (ref 0–200)
Basophils Relative: 0.9 %
Eosinophils Absolute: 151 cells/uL (ref 15–500)
Eosinophils Relative: 2.8 %
HCT: 46 % (ref 38.5–50.0)
Hemoglobin: 15.4 g/dL (ref 13.2–17.1)
Lymphs Abs: 1787 cells/uL (ref 850–3900)
MCH: 29.6 pg (ref 27.0–33.0)
MCHC: 33.5 g/dL (ref 32.0–36.0)
MCV: 88.3 fL (ref 80.0–100.0)
MPV: 9.9 fL (ref 7.5–12.5)
Monocytes Relative: 8.9 %
Neutro Abs: 2932 cells/uL (ref 1500–7800)
Neutrophils Relative %: 54.3 %
Platelets: 260 10*3/uL (ref 140–400)
RBC: 5.21 10*6/uL (ref 4.20–5.80)
RDW: 12.9 % (ref 11.0–15.0)
Total Lymphocyte: 33.1 %
WBC: 5.4 10*3/uL (ref 3.8–10.8)

## 2018-09-20 LAB — COMPREHENSIVE METABOLIC PANEL
AG Ratio: 1.8 (calc) (ref 1.0–2.5)
ALT: 35 U/L (ref 9–46)
AST: 21 U/L (ref 10–35)
Albumin: 4.2 g/dL (ref 3.6–5.1)
Alkaline phosphatase (APISO): 75 U/L (ref 35–144)
BUN: 18 mg/dL (ref 7–25)
CO2: 28 mmol/L (ref 20–32)
Calcium: 9.2 mg/dL (ref 8.6–10.3)
Chloride: 100 mmol/L (ref 98–110)
Creat: 1.13 mg/dL (ref 0.70–1.33)
Globulin: 2.3 g/dL (calc) (ref 1.9–3.7)
Glucose, Bld: 117 mg/dL — ABNORMAL HIGH (ref 65–99)
Potassium: 3.8 mmol/L (ref 3.5–5.3)
Sodium: 139 mmol/L (ref 135–146)
Total Bilirubin: 0.6 mg/dL (ref 0.2–1.2)
Total Protein: 6.5 g/dL (ref 6.1–8.1)

## 2018-09-20 LAB — LIPID PANEL
Cholesterol: 218 mg/dL — ABNORMAL HIGH (ref <200)
HDL: 34 mg/dL — ABNORMAL LOW (ref >=40)
Non-HDL Cholesterol (Calc): 184 mg/dL (calc) — ABNORMAL HIGH (ref <130)
Total CHOL/HDL Ratio: 6.4 (calc) — ABNORMAL HIGH (ref <5.0)
Triglycerides: 550 mg/dL — ABNORMAL HIGH (ref <150)

## 2018-09-20 LAB — HEMOGLOBIN A1C
Hgb A1c MFr Bld: 6.3 % of total Hgb — ABNORMAL HIGH (ref <5.7)
Mean Plasma Glucose: 134 (calc)
eAG (mmol/L): 7.4 (calc)

## 2018-09-25 ENCOUNTER — Encounter: Payer: Self-pay | Admitting: Family Medicine

## 2018-09-25 ENCOUNTER — Ambulatory Visit: Payer: BC Managed Care – PPO | Admitting: Family Medicine

## 2018-09-25 ENCOUNTER — Other Ambulatory Visit: Payer: Self-pay

## 2018-09-25 VITALS — BP 130/90 | HR 76 | Temp 98.5°F | Resp 15 | Ht 67.0 in | Wt 167.0 lb

## 2018-09-25 DIAGNOSIS — Z23 Encounter for immunization: Secondary | ICD-10-CM

## 2018-09-25 DIAGNOSIS — I10 Essential (primary) hypertension: Secondary | ICD-10-CM | POA: Diagnosis not present

## 2018-09-25 DIAGNOSIS — R7303 Prediabetes: Secondary | ICD-10-CM | POA: Diagnosis not present

## 2018-09-25 DIAGNOSIS — E7800 Pure hypercholesterolemia, unspecified: Secondary | ICD-10-CM | POA: Diagnosis not present

## 2018-09-25 DIAGNOSIS — E781 Pure hyperglyceridemia: Secondary | ICD-10-CM

## 2018-09-25 MED ORDER — ATORVASTATIN CALCIUM 80 MG PO TABS: ORAL_TABLET | ORAL | 3 refills | Status: DC

## 2018-09-25 MED ORDER — AMLODIPINE BESYLATE 10 MG PO TABS: 10.0000 mg | ORAL_TABLET | Freq: Every day | ORAL | 3 refills | Status: DC

## 2018-09-25 NOTE — Progress Notes (Signed)
Subjective:    Patient ID: Nathaniel Porter, male    DOB: 1960/02/09, 58 y.o.   MRN: [id number]  HPI   Patient is here today for a follow-up of his chronic medical conditions.  His blood pressures well controlled today.  He denies any chest pain shortness of breath or dyspnea on exertion.  His most recent fasting lab work is listed below.  He has been off his Lipitor for 3 weeks.  He denies any right upper quadrant pain or myalgias on Lipitor. Appointment on 09/19/2018  Component Date Value Ref Range Status  . WBC 09/19/2018 5.4  3.8 - 10.8 Thousand/uL Final  . RBC 09/19/2018 5.21  4.20 - 5.80 Million/uL Final  . Hemoglobin 09/19/2018 15.4  13.2 - 17.1 g/dL Final  . HCT 09/19/2018 46.0  38.5 - 50.0 % Final  . MCV 09/19/2018 88.3  80.0 - 100.0 fL Final  . MCH 09/19/2018 29.6  27.0 - 33.0 pg Final  . MCHC 09/19/2018 33.5  32.0 - 36.0 g/dL Final  . RDW 09/19/2018 12.9  11.0 - 15.0 % Final  . Platelets 09/19/2018 260  140 - 400 Thousand/uL Final  . MPV 09/19/2018 9.9  7.5 - 12.5 fL Final  . Neutro Abs 09/19/2018 2,932  1,500 - 7,800 cells/uL Final  . Lymphs Abs 09/19/2018 1,787  850 - 3,900 cells/uL Final  . Absolute Monocytes 09/19/2018 481  200 - 950 cells/uL Final  . Eosinophils Absolute 09/19/2018 151  15 - 500 cells/uL Final  . Basophils Absolute 09/19/2018 49  0 - 200 cells/uL Final  . Neutrophils Relative % 09/19/2018 54.3  % Final  . Total Lymphocyte 09/19/2018 33.1  % Final  . Monocytes Relative 09/19/2018 8.9  % Final  . Eosinophils Relative 09/19/2018 2.8  % Final  . Basophils Relative 09/19/2018 0.9  % Final  . Glucose, Bld 09/19/2018 117* 65 - 99 mg/dL Final   Comment: .            Fasting reference interval . For someone without known diabetes, a glucose value between 100 and 125 mg/dL is consistent with prediabetes and should be confirmed with a follow-up test. .   . BUN 09/19/2018 18  7 - 25 mg/dL Final  . Creat 09/19/2018 1.13  0.70 - 1.33 mg/dL Final   Comment: For patients >47 years of age, the reference limit for Creatinine is approximately 13% higher for people identified as African-American. .   Havery Moros Ratio [id number] NOT APPLICABLE  6 - 22 (calc) Final  . Sodium 09/19/2018 139  135 - 146 mmol/L Final  . Potassium 09/19/2018 3.8  3.5 - 5.3 mmol/L Final  . Chloride 09/19/2018 100  98 - 110 mmol/L Final  . CO2 09/19/2018 28  20 - 32 mmol/L Final  . Calcium 09/19/2018 9.2  8.6 - 10.3 mg/dL Final  . Total Protein 09/19/2018 6.5  6.1 - 8.1 g/dL Final  . Albumin 09/19/2018 4.2  3.6 - 5.1 g/dL Final  . Globulin 09/19/2018 2.3  1.9 - 3.7 g/dL (calc) Final  . AG Ratio 09/19/2018 1.8  1.0 - 2.5 (calc) Final  . Total Bilirubin 09/19/2018 0.6  0.2 - 1.2 mg/dL Final  . Alkaline phosphatase (APISO) 09/19/2018 75  35 - 144 U/L Final  . AST 09/19/2018 21  10 - 35 U/L Final  . ALT 09/19/2018 35  9 - 46 U/L Final  . Hgb A1c MFr Bld 09/19/2018 6.3* <5.7 % of total Hgb Final   Comment: For  someone without known diabetes, a hemoglobin  A1c value between 5.7% and 6.4% is consistent with prediabetes and should be confirmed with a  follow-up test. . For someone with known diabetes, a value <7% indicates that their diabetes is well controlled. A1c targets should be individualized based on duration of diabetes, age, comorbid conditions, and other considerations. . This assay result is consistent with an increased risk of diabetes. . Currently, no consensus exists regarding use of hemoglobin A1c for diagnosis of diabetes for children. .   . Mean Plasma Glucose 09/19/2018 134  (calc) Final  . eAG (mmol/L) 09/19/2018 7.4  (calc) Final  . Cholesterol 09/19/2018 218* <200 mg/dL Final  . HDL 09/19/2018 34* > OR = 40 mg/dL Final  . Triglycerides 09/19/2018 550* <150 mg/dL Final   Comment: . If a non-fasting specimen was collected, consider repeat triglyceride testing on a fasting specimen if clinically indicated.  Yates Decamp et al. J. of  Clin. Lipidol. [id number]. . . There is increased risk of pancreatitis when the  triglyceride concentration is very high  (> or = 500 mg/dL, especially if > or = 1000 mg/dL).  Yates Decamp et al. J. of Clin. Lipidol. [id number]. .   . LDL Cholesterol (Calc) 09/19/2018   mg/dL (calc) Final   Comment: . LDL cholesterol not calculated. Triglyceride levels greater than 400 mg/dL invalidate calculated LDL results. . Reference range: <100 . Desirable range <100 mg/dL for primary prevention;   <70 mg/dL for patients with CHD or diabetic patients  with > or = 2 CHD risk factors. Marland Kitchen LDL-C is now calculated using the Martin-Hopkins  calculation, which is a validated novel method providing  better accuracy than the Friedewald equation in the  estimation of LDL-C.  Cresenciano Genre et al. Annamaria Helling. [id number]): 2061-2068  (http://education.[facility].com/faq/FAQ164)   . Total CHOL/HDL Ratio 09/19/2018 6.4* <5.0 (calc) Final  . Non-HDL Cholesterol (Calc) 09/19/2018 184* <130 mg/dL (calc) Final   Comment: For patients with diabetes plus 1 major ASCVD risk  factor, treating to a non-HDL-C goal of <100 mg/dL  (LDL-C of <70 mg/dL) is considered a therapeutic  option.     Past Medical History:  Diagnosis Date  . Barrett esophagus   . BCC (basal cell carcinoma)   . Cerebral cyst   . Chronic otitis media of right ear   . GERD (gastroesophageal reflux disease)   . H/O hematuria   . 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 Hiatal hernia   . High cholesterol   . Hx MRSA infection    nose  . Prediabetes   . Syncope    Past Surgical History:  Procedure Laterality Date  . HERNIA REPAIR Right 01/2008  . MOLE SURGERY  2014   basil cell  . NOSE SURGERY     x 3-4   Current Outpatient Medications on File Prior to Visit  Medication Sig Dispense Refill  . Garlic [id number] MG CAPS Take by mouth.    . hydrochlorothiazide (HYDRODIURIL) 25 MG tablet TAKE 1 TABLET BY MOUTH EVERY DAY 90 tablet 3  . omeprazole  (PRILOSEC) 20 MG capsule TAKE ONE CAPSULE BY MOUTH EVERY DAY (Patient taking differently: TAKE ONE CAPSULE BY MOUTH BID) 30 capsule 11  . vitamin E 100 UNIT capsule Take by mouth daily.    Marland Kitchen ALPRAZolam (XANAX) 0.25 MG tablet Take 1 tablet (0.25 mg total) by mouth 2 (two) times daily as needed for anxiety. (Patient not taking: Reported on 09/25/2018) 10 tablet 0  . fish oil-omega-3 fatty acids 1000  MG capsule Take 1 g by mouth daily.     No current facility-administered medications on file prior to visit.    Allergies  Allergen Reactions  . Amoxicillin     REACTION: Rash/shortness of breath  . Codeine     REACTION: iritability  . Oxycodone-Acetaminophen      Can not take 5-500mg  causes chest tightness and pain   . Penicillins    Social History   Socioeconomic History  . Marital status: Married    Spouse name: Not on file  . Number of children: 2  . Years of education: 84  . Highest education level: Not on file  Occupational History    Employer: [employer]  . Financial resource strain: Not on file  . Food insecurity    Worry: Not on file    Inability: Not on file  . Transportation needs    Medical: Not on file    Non-medical: Not on file  Tobacco Use  . Smoking status: Never Smoker  . Smokeless tobacco: Never Used  Substance and Sexual Activity  . Alcohol use: No  . Drug use: No  . Sexual activity: Not on file  Lifestyle  . Physical activity    Days per week: Not on file    Minutes per session: Not on file  . Stress: Not on file  Relationships  . Social Herbalist on phone: Not on file    Gets together: Not on file    Attends religious service: Not on file    Active member of club or organization: Not on file    Attends meetings of clubs or organizations: Not on file    Relationship status: Not on file  . Intimate partner violence    Fear of current or ex partner: Not on file    Emotionally abused: Not on file    Physically abused:  Not on file    Forced sexual activity: Not on file  Other Topics Concern  . Not on file  Social History Narrative   Patient lives at home with his wife . 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 He denies smoking, drinking.      Review of Systems  All other systems reviewed and are negative.      Objective:   Physical Exam Vitals signs reviewed.  Constitutional:      Appearance: He is well-developed.  Cardiovascular:     Rate and Rhythm: Normal rate and regular rhythm.     Heart sounds: Normal heart sounds.  Pulmonary:     Effort: Pulmonary effort is normal. No respiratory distress.     Breath sounds: Normal breath sounds. No stridor. No wheezing.  Abdominal:     General: Bowel sounds are normal.     Palpations: Abdomen is soft.           Assessment & Plan:  Benign essential HTN  Need for immunization against influenza - Plan: Flu Vaccine QUAD 36+ mos IM  Pure hypercholesterolemia  Prediabetes  Hypertriglyceridemia Due to his triglycerides being over 500 I have recommended that the patient discontinue hydrochlorothiazide and replace it with amlodipine 10 mg a day.  We discussed fenofibrate versus adding fish oil to his atorvastatin 80 mg a day.  We discussed the risk of myopathy combining fenofibrate with high-dose atorvastatin.  Patient elects to try fish oil 2000 mg daily and be compliant taking his Lipitor 80 mg a  day and then rechecking fasting lab work in 3 months.  He also received his flu shot today.

## 2018-11-17 ENCOUNTER — Encounter (INDEPENDENT_AMBULATORY_CARE_PROVIDER_SITE_OTHER): Payer: Self-pay

## 2018-11-29 ENCOUNTER — Telehealth: Payer: Self-pay | Admitting: Family Medicine

## 2018-11-29 NOTE — Telephone Encounter (Signed)
Patient called in with c/o chest tightness that has been going on since he started on Amlodipine. States that it does not hurt but does feel periodically. Tightness will usually let up in the evening. Scheduled patient for an appointment with Dr. Dennard Schaumann tomorrow at 70. Advised patient to go to the ER if symptoms worsens.

## 2018-11-30 ENCOUNTER — Other Ambulatory Visit: Payer: Self-pay

## 2018-11-30 ENCOUNTER — Encounter: Payer: Self-pay | Admitting: Family Medicine

## 2018-11-30 ENCOUNTER — Ambulatory Visit: Payer: BC Managed Care – PPO | Admitting: Family Medicine

## 2018-11-30 VITALS — BP 110/70 | HR 78 | Temp 96.9°F | Resp 16 | Ht 67.0 in | Wt 164.0 lb

## 2018-11-30 DIAGNOSIS — R0789 Other chest pain: Secondary | ICD-10-CM

## 2018-11-30 MED ORDER — HYDROCHLOROTHIAZIDE 25 MG PO TABS: 25.0000 mg | ORAL_TABLET | Freq: Every day | ORAL | 3 refills | Status: DC

## 2018-11-30 NOTE — Progress Notes (Signed)
Subjective:    Patient ID: Nathaniel Porter, male    DOB: March 11, 1960, 58 y.o.   MRN: [id number]  HPI  09/25/18 Patient is here today for a follow-up of his chronic medical conditions.  His blood pressures well controlled today.  He denies any chest pain shortness of breath or dyspnea on exertion.  His most recent fasting lab work is listed below.  He has been off his Lipitor for 3 weeks.  He denies any right upper quadrant pain or myalgias on Lipitor. No visits with results within 1 Month(s) from this visit.  Latest known visit with results is:  Appointment on 09/19/2018  Component Date Value Ref Range Status  . WBC 09/19/2018 5.4  3.8 - 10.8 Thousand/uL Final  . RBC 09/19/2018 5.21  4.20 - 5.80 Million/uL Final  . Hemoglobin 09/19/2018 15.4  13.2 - 17.1 g/dL Final  . HCT 09/19/2018 46.0  38.5 - 50.0 % Final  . MCV 09/19/2018 88.3  80.0 - 100.0 fL Final  . MCH 09/19/2018 29.6  27.0 - 33.0 pg Final  . MCHC 09/19/2018 33.5  32.0 - 36.0 g/dL Final  . RDW 09/19/2018 12.9  11.0 - 15.0 % Final  . Platelets 09/19/2018 260  140 - 400 Thousand/uL Final  . MPV 09/19/2018 9.9  7.5 - 12.5 fL Final  . Neutro Abs 09/19/2018 2,932  1,500 - 7,800 cells/uL Final  . Lymphs Abs 09/19/2018 1,787  850 - 3,900 cells/uL Final  . Absolute Monocytes 09/19/2018 481  200 - 950 cells/uL Final  . Eosinophils Absolute 09/19/2018 151  15 - 500 cells/uL Final  . Basophils Absolute 09/19/2018 49  0 - 200 cells/uL Final  . Neutrophils Relative % 09/19/2018 54.3  % Final  . Total Lymphocyte 09/19/2018 33.1  % Final  . Monocytes Relative 09/19/2018 8.9  % Final  . Eosinophils Relative 09/19/2018 2.8  % Final  . Basophils Relative 09/19/2018 0.9  % Final  . Glucose, Bld 09/19/2018 117* 65 - 99 mg/dL Final   Comment: .            Fasting reference interval . For someone without known diabetes, a glucose value between 100 and 125 mg/dL is consistent with prediabetes and should be confirmed with a follow-up test. .    . BUN 09/19/2018 18  7 - 25 mg/dL Final  . Creat 09/19/2018 1.13  0.70 - 1.33 mg/dL Final   Comment: For patients >55 years of age, the reference limit for Creatinine is approximately 13% higher for people identified as African-American. .   Havery Moros Ratio [id number] NOT APPLICABLE  6 - 22 (calc) Final  . Sodium 09/19/2018 139  135 - 146 mmol/L Final  . Potassium 09/19/2018 3.8  3.5 - 5.3 mmol/L Final  . Chloride 09/19/2018 100  98 - 110 mmol/L Final  . CO2 09/19/2018 28  20 - 32 mmol/L Final  . Calcium 09/19/2018 9.2  8.6 - 10.3 mg/dL Final  . Total Protein 09/19/2018 6.5  6.1 - 8.1 g/dL Final  . Albumin 09/19/2018 4.2  3.6 - 5.1 g/dL Final  . Globulin 09/19/2018 2.3  1.9 - 3.7 g/dL (calc) Final  . AG Ratio 09/19/2018 1.8  1.0 - 2.5 (calc) Final  . Total Bilirubin 09/19/2018 0.6  0.2 - 1.2 mg/dL Final  . Alkaline phosphatase (APISO) 09/19/2018 75  35 - 144 U/L Final  . AST 09/19/2018 21  10 - 35 U/L Final  . ALT 09/19/2018 35  9 - 46 U/L  Final  . Hgb A1c MFr Bld 09/19/2018 6.3* <5.7 % of total Hgb Final   Comment: For someone without known diabetes, a hemoglobin  A1c value between 5.7% and 6.4% is consistent with prediabetes and should be confirmed with a  follow-up test. . For someone with known diabetes, a value <7% indicates that their diabetes is well controlled. A1c targets should be individualized based on duration of diabetes, age, comorbid conditions, and other considerations. . This assay result is consistent with an increased risk of diabetes. . Currently, no consensus exists regarding use of hemoglobin A1c for diagnosis of diabetes for children. .   . Mean Plasma Glucose 09/19/2018 134  (calc) Final  . eAG (mmol/L) 09/19/2018 7.4  (calc) Final  . Cholesterol 09/19/2018 218* <200 mg/dL Final  . HDL 09/19/2018 34* > OR = 40 mg/dL Final  . Triglycerides 09/19/2018 550* <150 mg/dL Final   Comment: . If a non-fasting specimen was collected, consider  repeat triglyceride testing on a fasting specimen if clinically indicated.  Yates Decamp et al. J. of Clin. Lipidol. [id number]. . . There is increased risk of pancreatitis when the  triglyceride concentration is very high  (> or = 500 mg/dL, especially if > or = 1000 mg/dL).  Yates Decamp et al. J. of Clin. Lipidol. [id number]. .   . LDL Cholesterol (Calc) 09/19/2018   mg/dL (calc) Final   Comment: . LDL cholesterol not calculated. Triglyceride levels greater than 400 mg/dL invalidate calculated LDL results. . Reference range: <100 . Desirable range <100 mg/dL for primary prevention;   <70 mg/dL for patients with CHD or diabetic patients  with > or = 2 CHD risk factors. Marland Kitchen LDL-C is now calculated using the Martin-Hopkins  calculation, which is a validated novel method providing  better accuracy than the Friedewald equation in the  estimation of LDL-C.  Cresenciano Genre et al. Annamaria Helling. [id number]): 2061-2068  (http://education.[facility].com/faq/FAQ164)   . Total CHOL/HDL Ratio 09/19/2018 6.4* <5.0 (calc) Final  . Non-HDL Cholesterol (Calc) 09/19/2018 184* <130 mg/dL (calc) Final   Comment: For patients with diabetes plus 1 major ASCVD risk  factor, treating to a non-HDL-C goal of <100 mg/dL  (LDL-C of <70 mg/dL) is considered a therapeutic  option.   At that time, my plan was: Due to his triglycerides being over 500 I have recommended that the patient discontinue hydrochlorothiazide and replace it with amlodipine 10 mg a day.  We discussed fenofibrate versus adding fish oil to his atorvastatin 80 mg a day.  We discussed the risk of myopathy combining fenofibrate with high-dose atorvastatin.  Patient elects to try fish oil 2000 mg daily and be compliant taking his Lipitor 80 mg a day and then rechecking fasting lab work in 3 months.  He also received his flu shot today.  11/30/18 Patient had a normal stress echo in 04/2013.  Patient states that ever since we switched his  hydrochlorothiazide to amlodipine, he has had chest tightness.  He believes the medication amlodipine is causing it.  This is been going on now for several weeks.  It occurs almost on a daily basis.  He reports a tightness in the chest.  It is more of a pressure-like sensation.  He denies any significant pain.  He denies any shortness of breath.  The pressure occurs throughout the entire day.  At times it is worse than others.  It would usually go away at night when he lies down to go to sleep.  He denies any cough.  He denies  any pleurisy.  He denies any hemoptysis.  He denies any purulent sputum.  He denies any angina.  Exercise does not intensify the pain.  Physical activity does not intensify the pain.  The pain/pressure is not made worse by using his pectoralis muscles or any range of motion activity.  Pressing on his sternum and his ribs does not exacerbate the pain.  Food does not exacerbate the pain.  He denies any indigestion or burping or GERD-like symptoms.  He denies any melena or hematochezia.  The pain in the pressure is very atypical and does not point to any specific cause based on his history.  EKG today shows normal sinus rhythm with no evidence of ischemia or infarction with normal intervals and a normal axis.  Past Medical History:  Diagnosis Date  . Barrett esophagus   . BCC (basal cell carcinoma)   . Cerebral cyst   . Chronic otitis media of right ear   . GERD (gastroesophageal reflux disease)   . H/O hematuria   . 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 Hiatal hernia   . High cholesterol   . Hx MRSA infection    nose  . Prediabetes   . Syncope    Past Surgical History:  Procedure Laterality Date  . HERNIA REPAIR Right 01/2008  . MOLE SURGERY  2014   basil cell  . NOSE SURGERY     x 3-4   Current Outpatient Medications on File Prior to Visit  Medication Sig Dispense Refill  . ALPRAZolam (XANAX) 0.25 MG tablet Take 1 tablet (0.25 mg total) by mouth 2 (two) times daily as needed for  anxiety. (Patient not taking: Reported on 09/25/2018) 10 tablet 0  . amLODipine (NORVASC) 10 MG tablet Take 1 tablet (10 mg total) by mouth daily. 90 tablet 3  . atorvastatin (LIPITOR) 80 MG tablet TAKE 1 TABLET BY MOUTH DAILY. NEEDS OFFICE VISIT AND LABS BEFORE FURTHER REFILLS 90 tablet 3  . fish oil-omega-3 fatty acids 1000 MG capsule Take 1 g by mouth daily.    . Garlic 123XX123 MG CAPS Take by mouth.    . hydrochlorothiazide (HYDRODIURIL) 25 MG tablet TAKE 1 TABLET BY MOUTH EVERY DAY 90 tablet 3  . omeprazole (PRILOSEC) 20 MG capsule TAKE ONE CAPSULE BY MOUTH EVERY DAY (Patient taking differently: TAKE ONE CAPSULE BY MOUTH BID) 30 capsule 11  . vitamin E 100 UNIT capsule Take by mouth daily.     No current facility-administered medications on file prior to visit.    Allergies  Allergen Reactions  . Amoxicillin     REACTION: Rash/shortness of breath  . Codeine     REACTION: iritability  . Oxycodone-Acetaminophen      Can not take 5-500mg  causes chest tightness and pain   . Penicillins    Social History   Socioeconomic History  . Marital status: Married    Spouse name: Not on file  . Number of children: 2  . Years of education: 40  . Highest education level: Not on file  Occupational History    Employer: [employer]  . Financial resource strain: Not on file  . Food insecurity    Worry: Not on file    Inability: Not on file  . Transportation needs    Medical: Not on file    Non-medical: Not on file  Tobacco Use  . Smoking status: Never Smoker  . Smokeless tobacco: Never Used  Substance and Sexual Activity  . Alcohol use:  No  . Drug use: No  . Sexual activity: Not on file  Lifestyle  . Physical activity    Days per week: Not on file    Minutes per session: Not on file  . Stress: Not on file  Relationships  . Social Herbalist on phone: Not on file    Gets together: Not on file    Attends religious service: Not on file    Active member of  club or organization: Not on file    Attends meetings of clubs or organizations: Not on file    Relationship status: Not on file  . Intimate partner violence    Fear of current or ex partner: Not on file    Emotionally abused: Not on file    Physically abused: Not on file    Forced sexual activity: Not on file  Other Topics Concern  . Not on file  Social History Narrative   Patient lives at home with his wife . 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 He denies smoking, drinking.      Review of Systems  All other systems reviewed and are negative.      Objective:   Physical Exam Vitals signs reviewed.  Constitutional:      Appearance: He is well-developed.  Cardiovascular:     Rate and Rhythm: Normal rate and regular rhythm.     Heart sounds: Normal heart sounds.  Pulmonary:     Effort: Pulmonary effort is normal. No respiratory distress.     Breath sounds: Normal breath sounds. No stridor. No wheezing.  Abdominal:     General: Bowel sounds are normal.     Palpations: Abdomen is soft.           Assessment & Plan:  Chest tightness - Plan: EKG 12-Lead  I explained to the patient that this is not a common side effect of amlodipine.  In fact I cannot explain how amlodipine would cause the symptoms.  However I am willing to let the patient switch from amlodipine back to his hydrochlorothiazide just to see if the symptoms do improve and if it could be related to medication.  If I had to venture a guess, I believe this may be more stress or anxiety than anything else however the patient really denies any significant stress or anxiety right now.  Given his age, and his medical risk factors including hypertension hyperlipidemia prediabetes and hypertriglyceridemia, I do recommend a cardiology referral for possible stress test however again my pretest probability for cardiac cause is extremely low.  Consider imaging  of the chest including a chest x-ray if symptoms persist despite stopping the amlodipine and if his cardiac work-up is negative.  Seek medical attention immediately if the chest tightness intensifies dramatically or if he develops shortness of breath associated with it

## 2018-12-18 DIAGNOSIS — E781 Pure hyperglyceridemia: Secondary | ICD-10-CM | POA: Insufficient documentation

## 2018-12-18 DIAGNOSIS — R0789 Other chest pain: Secondary | ICD-10-CM | POA: Insufficient documentation

## 2018-12-18 NOTE — Progress Notes (Signed)
Patient referred by [name], MD for atypical chest pain.  Subjective:   Nathaniel Porter, male    DOB: 02/20/1960, 58 y.o.   MRN: [id number]   Chief Complaint  Patient presents with  . Chest Pain    chest tightness  . New Patient (Initial Visit)    HPI  58 y.o. Caucasian male with hypertension, hypertriglyceridemia, family h/o early CAD, atypical chest pain.  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  He is now retired.  He is to be more active physically when he was working, however, still stays active taking care of his property.  He has had episodes of central chest tightness that are not particularly related to exertion.  He thinks that pain was increased after he was started on amlodipine, seems to have improved after stopping amlodipine.  However, he still has occasional episodes of chest tightness.  On the most recent lipid panel checked, his triglycerides were greater than 500, and LDL could not be calculated.  He has been on atorvastatin 80 mg daily.  He has noticed pain in his right calf at rest, since being on atorvastatin 80 mg.  His PCP had taken hydrochlorothiazide as potential cause for elevated triglycerides.  However, it had to be restarted after his amlodipine was stopped due to possibility of side effects.   Patient has family history of early CAD with his brother having had a heart attack at age 84.    Past Medical History:  Diagnosis Date  . Barrett esophagus   . BCC (basal cell carcinoma)   . Cerebral cyst   . Chronic otitis media of right ear   . GERD (gastroesophageal reflux disease)   . H/O hematuria   . 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 Hiatal hernia   . High cholesterol   . Hx MRSA infection    nose  . Prediabetes   . Syncope      Past Surgical History:  Procedure Laterality Date  . HERNIA REPAIR Right 01/2008  . MOLE SURGERY  2014   basil cell  . NOSE SURGERY     x 3-4      Social History   Tobacco Use  Smoking Status Never Smoker  Smokeless Tobacco Never Used    Social History   Substance and Sexual Activity  Alcohol Use No     Family History  Problem Relation Age of Onset  . Cancer Father        bladder  . Hypertension Brother   . Diabetes Paternal Uncle      Current Outpatient Medications on File Prior to Visit  Medication Sig Dispense Refill  . atorvastatin (LIPITOR) 80 MG tablet TAKE 1 TABLET BY MOUTH DAILY. NEEDS OFFICE VISIT AND LABS BEFORE FURTHER REFILLS 90 tablet 3  . fish oil-omega-3 fatty acids 1000 MG capsule Take 1 g by mouth daily.    . Garlic 0300 MG CAPS Take by mouth.    . hydrochlorothiazide (HYDRODIURIL) 25 MG tablet Take 1 tablet (25 mg total) by mouth daily. 30 tablet 3  . omeprazole (PRILOSEC) 20 MG capsule TAKE ONE CAPSULE BY MOUTH EVERY DAY (Patient taking differently: TAKE ONE CAPSULE BY MOUTH BID) 30 capsule 11  . vitamin E 100 UNIT capsule Take by mouth daily.     No current facility-administered medications on file prior to visit.     Cardiovascular and other pertinent studies:  EKG 11/30/2018: Sinus rhythm 74  bpm. Normal EKG.  Recent labs: 11/30/2018: Glucose 117, BUN/Cr 18/1.13. EGFR 85. Na/K 139/3.8. Rest of the CMP normal H/H 15/46. MCV 88. Platelets 260 HbA1C 6.3% Chol 218, TG 550, HDL 34, LDL could not be calculated    Review of Systems  Constitution: Negative for decreased appetite, malaise/fatigue, weight gain and weight loss.  HENT: Negative for congestion.   Eyes: Negative for visual disturbance.  Cardiovascular: Positive for chest pain. Negative for dyspnea on exertion, leg swelling, palpitations and syncope.  Respiratory: Negative for cough.   Endocrine: Negative for cold intolerance.  Hematologic/Lymphatic: Does not bruise/bleed easily.  Skin: Negative for itching and rash.  Musculoskeletal: Negative for myalgias.  Gastrointestinal: Negative for abdominal pain, nausea and vomiting.   Genitourinary: Negative for dysuria.  Neurological: Negative for dizziness and weakness.  Psychiatric/Behavioral: The patient is not nervous/anxious.   All other systems reviewed and are negative.        Vitals:   12/20/18 0912  BP: (!) 148/92  Pulse: 85  SpO2: 97%     Body mass index is 26.63 kg/m. Filed Weights   12/20/18 0912  Weight: 165 lb (74.8 kg)     Objective:   Physical Exam  Constitutional: He is oriented to person, place, and time. He appears well-developed and well-nourished. No distress.  HENT:  Head: Normocephalic and atraumatic.  Eyes: Pupils are equal, round, and reactive to light. Conjunctivae are normal.  Neck: No JVD present.  Cardiovascular: Normal rate, regular rhythm and intact distal pulses.  No murmur heard. Pulmonary/Chest: Effort normal and breath sounds normal. He has no wheezes. He has no rales.  Abdominal: Soft. Bowel sounds are normal. There is no rebound.  Musculoskeletal:        General: No edema.  Lymphadenopathy:    He has no cervical adenopathy.  Neurological: He is alert and oriented to person, place, and time. No cranial nerve deficit.  Skin: Skin is warm and dry.  Psychiatric: He has a normal mood and affect.  Nursing note and vitals reviewed.       Assessment & Recommendations:   58 y.o. Caucasian male with hypertension, hypertriglyceridemia, atypical chest pain.  1. Atypical chest pain I agree with Dr. Alvino Chapel that chest pain is probably not related to amlodipine. While his chest pain is atypical, he has risk factors and family history of CAD, hypertension, and elevated triglycerides.  Will obtain coronary CT angiogram.   I have stopped his hydrochlorothiazide, as it could be a potential confounder for elevated triglycerides.  Started on metoprolol succinate 50 mg daily, which would also help with lowering heart rate for better imaging with coronary CT angiogram.  2. Hypertriglyceridemia Recommend fasting lipid  panel, and direct LDL measurement.  This is scheduled next week with his PCP.  Stopping hydrochlorothiazide should remove this confounding factor.  If triglycerides remain elevated, I will switch his pain pressure to Vascepa 2 g twice daily.  3. Myalgia: Could be related to high-dose statin.  Okay to reduce atorvastatin to 40 mg daily.      Thank you for referring the patient to [place]. Please feel free to contact with any questions.  [name], MD [facility] Pager: [phone] Office: [phone]

## 2018-12-20 ENCOUNTER — Ambulatory Visit: Payer: BC Managed Care – PPO | Admitting: Cardiology

## 2018-12-20 ENCOUNTER — Encounter: Payer: Self-pay | Admitting: Cardiology

## 2018-12-20 ENCOUNTER — Telehealth: Payer: Self-pay

## 2018-12-20 ENCOUNTER — Other Ambulatory Visit: Payer: Self-pay

## 2018-12-20 VITALS — BP 125/85 | HR 76 | Ht 66.0 in | Wt 165.0 lb

## 2018-12-20 DIAGNOSIS — R072 Precordial pain: Secondary | ICD-10-CM

## 2018-12-20 DIAGNOSIS — M791 Myalgia, unspecified site: Secondary | ICD-10-CM

## 2018-12-20 DIAGNOSIS — E781 Pure hyperglyceridemia: Secondary | ICD-10-CM

## 2018-12-20 DIAGNOSIS — R0789 Other chest pain: Secondary | ICD-10-CM | POA: Diagnosis not present

## 2018-12-20 DIAGNOSIS — I10 Essential (primary) hypertension: Secondary | ICD-10-CM

## 2018-12-20 DIAGNOSIS — Z8249 Family history of ischemic heart disease and other diseases of the circulatory system: Secondary | ICD-10-CM

## 2018-12-20 MED ORDER — METOPROLOL SUCCINATE ER 50 MG PO TB24: 50.0000 mg | ORAL_TABLET | Freq: Every day | ORAL | 3 refills | Status: DC

## 2018-12-20 NOTE — Patient Instructions (Signed)
Stop HCTZ Start metoprolol succinate 50 mg daily Check fasting lipid panel and direct LDL with Dr. Dennard Schaumann next week.  Your cardiac CT will be scheduled at the locations below:   [facility]  [address]  [place], [place] [address]  [phone]    If scheduled at [facility], please arrive at the [facility] main entrance of [facility] 30-45 minutes prior to test start time.  Proceed to the [facility] Radiology Department (first floor) to check-in and test prep.   Please follow these instructions carefully (unless otherwise directed):   Hold all erectile dysfunction medications at least 3 days (72 hrs) prior to test.   On the Night Before the Test:   Be sure to Drink plenty of water.   Do not consume any caffeinated/decaffeinated beverages or chocolate 12 hours prior to your test.   Do not take any antihistamines 12 hours prior to your test.   If the patient has contrast allergy:  Patient will need a prescription for Prednisone and very clear instructions (as follows):  1. Prednisone 50 mg - take 13 hours prior to test  2. Take another Prednisone 50 mg 7 hours prior to test  3. Take another Prednisone 50 mg 1 hour prior to test  4. Take Benadryl 50 mg 1 hour prior to test   Patient must complete all four doses of above prophylactic medications.   Patient will need a ride after test due to Benadryl.   On the Day of the Test:   Drink plenty of water. Do not drink any water within one hour of the test.   Do not eat any food 4 hours prior to the test.   You may take your regular medications prior to the test.   - Metoprolol succinate   Take 50 mg 1 tab daily. Please take your pills with you for your CT scan, in case you need to take additional doses to control your heart rate better for the scan.         After the Test:   Drink plenty of water.   After receiving IV contrast, you may experience a mild flushed feeling. This is normal.    On occasion, you may experience a mild rash up to 24 hours after the test. This is not dangerous. If this occurs, you can take Benadryl 25 mg and increase your fluid intake.   If you experience trouble breathing, this can be serious. If it is severe call 911 IMMEDIATELY. If it is mild, please call our office.   If you take any of these medications: Glipizide/Metformin, Avandament, Glucavance, please do not take 48 hours after completing test unless otherwise instructed.     Please contact the cardiac imaging nurse navigator should you have any questions/concerns  Marchia Bond, RN Navigator Cardiac Imaging  [facility]  [phone] Office  [phone] Cell

## 2018-12-24 ENCOUNTER — Other Ambulatory Visit: Payer: Self-pay

## 2018-12-25 ENCOUNTER — Ambulatory Visit: Payer: BC Managed Care – PPO | Admitting: Family Medicine

## 2018-12-25 ENCOUNTER — Encounter: Payer: Self-pay | Admitting: Family Medicine

## 2018-12-25 VITALS — BP 122/78 | HR 62 | Temp 97.6°F | Resp 16 | Ht 67.0 in | Wt 167.0 lb

## 2018-12-25 DIAGNOSIS — E7800 Pure hypercholesterolemia, unspecified: Secondary | ICD-10-CM | POA: Diagnosis not present

## 2018-12-25 DIAGNOSIS — E781 Pure hyperglyceridemia: Secondary | ICD-10-CM

## 2018-12-25 DIAGNOSIS — R7303 Prediabetes: Secondary | ICD-10-CM | POA: Diagnosis not present

## 2018-12-25 DIAGNOSIS — I10 Essential (primary) hypertension: Secondary | ICD-10-CM

## 2018-12-25 NOTE — Progress Notes (Signed)
Subjective:    Patient ID: Nathaniel Porter, male    DOB: 07/20/1960, 58 y.o.   MRN: [id number]  HPI  09/25/18 Patient is here today for a follow-up of his chronic medical conditions.  His blood pressures well controlled today.  He denies any chest pain shortness of breath or dyspnea on exertion.  His most recent fasting lab work is listed below.  He has been off his Lipitor for 3 weeks.  He denies any right upper quadrant pain or myalgias on Lipitor. No visits with results within 1 Month(s) from this visit.  Latest known visit with results is:  Appointment on 09/19/2018  Component Date Value Ref Range Status  . WBC 09/19/2018 5.4  3.8 - 10.8 Thousand/uL Final  . RBC 09/19/2018 5.21  4.20 - 5.80 Million/uL Final  . Hemoglobin 09/19/2018 15.4  13.2 - 17.1 g/dL Final  . HCT 09/19/2018 46.0  38.5 - 50.0 % Final  . MCV 09/19/2018 88.3  80.0 - 100.0 fL Final  . MCH 09/19/2018 29.6  27.0 - 33.0 pg Final  . MCHC 09/19/2018 33.5  32.0 - 36.0 g/dL Final  . RDW 09/19/2018 12.9  11.0 - 15.0 % Final  . Platelets 09/19/2018 260  140 - 400 Thousand/uL Final  . MPV 09/19/2018 9.9  7.5 - 12.5 fL Final  . Neutro Abs 09/19/2018 2,932  1,500 - 7,800 cells/uL Final  . Lymphs Abs 09/19/2018 1,787  850 - 3,900 cells/uL Final  . Absolute Monocytes 09/19/2018 481  200 - 950 cells/uL Final  . Eosinophils Absolute 09/19/2018 151  15 - 500 cells/uL Final  . Basophils Absolute 09/19/2018 49  0 - 200 cells/uL Final  . Neutrophils Relative % 09/19/2018 54.3  % Final  . Total Lymphocyte 09/19/2018 33.1  % Final  . Monocytes Relative 09/19/2018 8.9  % Final  . Eosinophils Relative 09/19/2018 2.8  % Final  . Basophils Relative 09/19/2018 0.9  % Final  . Glucose, Bld 09/19/2018 117* 65 - 99 mg/dL Final   Comment: .            Fasting reference interval . For someone without known diabetes, a glucose value between 100 and 125 mg/dL is consistent with prediabetes and should be confirmed with a follow-up  test. .   . BUN 09/19/2018 18  7 - 25 mg/dL Final  . Creat 09/19/2018 1.13  0.70 - 1.33 mg/dL Final   Comment: For patients >46 years of age, the reference limit for Creatinine is approximately 13% higher for people identified as African-American. .   Havery Moros Ratio [id number] NOT APPLICABLE  6 - 22 (calc) Final  . Sodium 09/19/2018 139  135 - 146 mmol/L Final  . Potassium 09/19/2018 3.8  3.5 - 5.3 mmol/L Final  . Chloride 09/19/2018 100  98 - 110 mmol/L Final  . CO2 09/19/2018 28  20 - 32 mmol/L Final  . Calcium 09/19/2018 9.2  8.6 - 10.3 mg/dL Final  . Total Protein 09/19/2018 6.5  6.1 - 8.1 g/dL Final  . Albumin 09/19/2018 4.2  3.6 - 5.1 g/dL Final  . Globulin 09/19/2018 2.3  1.9 - 3.7 g/dL (calc) Final  . AG Ratio 09/19/2018 1.8  1.0 - 2.5 (calc) Final  . Total Bilirubin 09/19/2018 0.6  0.2 - 1.2 mg/dL Final  . Alkaline phosphatase (APISO) 09/19/2018 75  35 - 144 U/L Final  . AST 09/19/2018 21  10 - 35 U/L Final  . ALT 09/19/2018 35  9 - 46 U/L  Final  . Hgb A1c MFr Bld 09/19/2018 6.3* <5.7 % of total Hgb Final   Comment: For someone without known diabetes, a hemoglobin  A1c value between 5.7% and 6.4% is consistent with prediabetes and should be confirmed with a  follow-up test. . For someone with known diabetes, a value <7% indicates that their diabetes is well controlled. A1c targets should be individualized based on duration of diabetes, age, comorbid conditions, and other considerations. . This assay result is consistent with an increased risk of diabetes. . Currently, no consensus exists regarding use of hemoglobin A1c for diagnosis of diabetes for children. .   . Mean Plasma Glucose 09/19/2018 134  (calc) Final  . eAG (mmol/L) 09/19/2018 7.4  (calc) Final  . Cholesterol 09/19/2018 218* <200 mg/dL Final  . HDL 09/19/2018 34* > OR = 40 mg/dL Final  . Triglycerides 09/19/2018 550* <150 mg/dL Final   Comment: . If a non-fasting specimen was collected,  consider repeat triglyceride testing on a fasting specimen if clinically indicated.  Yates Decamp et al. J. of Clin. Lipidol. [id number]. . . There is increased risk of pancreatitis when the  triglyceride concentration is very high  (> or = 500 mg/dL, especially if > or = 1000 mg/dL).  Yates Decamp et al. J. of Clin. Lipidol. [id number]. .   . LDL Cholesterol (Calc) 09/19/2018   mg/dL (calc) Final   Comment: . LDL cholesterol not calculated. Triglyceride levels greater than 400 mg/dL invalidate calculated LDL results. . Reference range: <100 . Desirable range <100 mg/dL for primary prevention;   <70 mg/dL for patients with CHD or diabetic patients  with > or = 2 CHD risk factors. Marland Kitchen LDL-C is now calculated using the Martin-Hopkins  calculation, which is a validated novel method providing  better accuracy than the Friedewald equation in the  estimation of LDL-C.  Cresenciano Genre et al. Annamaria Helling. [id number]): 2061-2068  (http://education.[facility].com/faq/FAQ164)   . Total CHOL/HDL Ratio 09/19/2018 6.4* <5.0 (calc) Final  . Non-HDL Cholesterol (Calc) 09/19/2018 184* <130 mg/dL (calc) Final   Comment: For patients with diabetes plus 1 major ASCVD risk  factor, treating to a non-HDL-C goal of <100 mg/dL  (LDL-C of <70 mg/dL) is considered a therapeutic  option.   At that time, my plan was: Due to his triglycerides being over 500 I have recommended that the patient discontinue hydrochlorothiazide and replace it with amlodipine 10 mg a day.  We discussed fenofibrate versus adding fish oil to his atorvastatin 80 mg a day.  We discussed the risk of myopathy combining fenofibrate with high-dose atorvastatin.  Patient elects to try fish oil 2000 mg daily and be compliant taking his Lipitor 80 mg a day and then rechecking fasting lab work in 3 months.  He also received his flu shot today.  11/30/18 Patient had a normal stress echo in 04/2013.  Patient states that ever since we switched his  hydrochlorothiazide to amlodipine, he has had chest tightness.  He believes the medication amlodipine is causing it.  This is been going on now for several weeks.  It occurs almost on a daily basis.  He reports a tightness in the chest.  It is more of a pressure-like sensation.  He denies any significant pain.  He denies any shortness of breath.  The pressure occurs throughout the entire day.  At times it is worse than others.  It would usually go away at night when he lies down to go to sleep.  He denies any cough.  He denies  any pleurisy.  He denies any hemoptysis.  He denies any purulent sputum.  He denies any angina.  Exercise does not intensify the pain.  Physical activity does not intensify the pain.  The pain/pressure is not made worse by using his pectoralis muscles or any range of motion activity.  Pressing on his sternum and his ribs does not exacerbate the pain.  Food does not exacerbate the pain.  He denies any indigestion or burping or GERD-like symptoms.  He denies any melena or hematochezia.  The pain in the pressure is very atypical and does not point to any specific cause based on his history.  EKG today shows normal sinus rhythm with no evidence of ischemia or infarction with normal intervals and a normal axis.  At that time, my plan was: I explained to the patient that this is not a common side effect of amlodipine.  In fact I cannot explain how amlodipine would cause the symptoms.  However I am willing to let the patient switch from amlodipine back to his hydrochlorothiazide just to see if the symptoms do improve and if it could be related to medication.  If I had to venture a guess, I believe this may be more stress or anxiety than anything else however the patient really denies any significant stress or anxiety right now.  Given his age, and his medical risk factors including hypertension hyperlipidemia prediabetes and hypertriglyceridemia, I do recommend a cardiology referral for possible  stress test however again my pretest probability for cardiac cause is extremely low.  Consider imaging of the chest including a chest x-ray if symptoms persist despite stopping the amlodipine and if his cardiac work-up is negative.  Seek medical attention immediately if the chest tightness intensifies dramatically or if he develops shortness of breath associated with it  12/25/18 Patient has not experienced any further chest pain since I last saw him.  He did see cardiology and they have him scheduled for a cardiac CT in 2 weeks.  He denies taking the hydrochlorothiazide.  After he saw cardiology they switched him to metoprolol.  His blood pressure today is well controlled at 122/78.  He denies any palpitations or dizziness or lightheadedness.  He denies any erectile dysfunction on the medication.  He is due today to recheck his hemoglobin A1c as he has prediabetes.  He denies any neuropathy in his feet.  He recently reduced his Lipitor from 80 to 40 mg.  He denies any myalgias on the statin.  Past Medical History:  Diagnosis Date  . Barrett esophagus   . BCC (basal cell carcinoma)   . Cerebral cyst   . Chronic otitis media of right ear   . GERD (gastroesophageal reflux disease)   . H/O hematuria   . 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 Hiatal hernia   . High cholesterol   . Hx MRSA infection    nose  . Prediabetes   . Syncope    Past Surgical History:  Procedure Laterality Date  . HERNIA REPAIR Right 01/2008  . MOLE SURGERY  2014   basil cell  . NOSE SURGERY     x 3-4   Current Outpatient Medications on File Prior to Visit  Medication Sig Dispense Refill  . atorvastatin (LIPITOR) 80 MG tablet TAKE 1 TABLET BY MOUTH DAILY. NEEDS OFFICE VISIT AND LABS BEFORE FURTHER REFILLS 90 tablet 3  . fish oil-omega-3 fatty acids 1000 MG capsule Take 1 g by mouth daily.    . Garlic  1000 MG CAPS Take by mouth.    . metoprolol succinate (TOPROL-XL) 50 MG 24 hr tablet Take 1 tablet (50 mg total) by mouth daily.  Take with or immediately following a meal. 30 tablet 3  . omeprazole (PRILOSEC) 20 MG capsule TAKE ONE CAPSULE BY MOUTH EVERY DAY (Patient taking differently: TAKE ONE CAPSULE BY MOUTH BID) 30 capsule 11  . vitamin E 100 UNIT capsule Take by mouth daily.     No current facility-administered medications on file prior to visit.    Allergies  Allergen Reactions  . Amoxicillin Anaphylaxis    REACTION: Rash/shortness of breath  . Codeine     REACTION: iritability  . Oxycodone-Acetaminophen      Can not take 5-500mg  causes chest tightness and pain   . Penicillins    Social History   Socioeconomic History  . Marital status: Married    Spouse name: Not on file  . Number of children: 2  . Years of education: 37  . Highest education level: Not on file  Occupational History    Employer: [employer]  . Financial resource strain: Not on file  . Food insecurity    Worry: Not on file    Inability: Not on file  . Transportation needs    Medical: Not on file    Non-medical: Not on file  Tobacco Use  . Smoking status: Never Smoker  . Smokeless tobacco: Never Used  Substance and Sexual Activity  . Alcohol use: No  . Drug use: No  . Sexual activity: Not on file  Lifestyle  . Physical activity    Days per week: Not on file    Minutes per session: Not on file  . Stress: Not on file  Relationships  . Social Herbalist on phone: Not on file    Gets together: Not on file    Attends religious service: Not on file    Active member of club or organization: Not on file    Attends meetings of clubs or organizations: Not on file    Relationship status: Not on file  . Intimate partner violence    Fear of current or ex partner: Not on file    Emotionally abused: Not on file    Physically abused: Not on file    Forced sexual activity: Not on file  Other Topics Concern  . Not on file  Social History Narrative   Patient lives at home with his wife . Patient works  for [employer] as Engineer, manufacturing systems, Patient has two children. He denies smoking, drinking.      Review of Systems  All other systems reviewed and are negative.      Objective:   Physical Exam Vitals signs reviewed.  Constitutional:      Appearance: He is well-developed.  Cardiovascular:     Rate and Rhythm: Normal rate and regular rhythm.     Heart sounds: Normal heart sounds.  Pulmonary:     Effort: Pulmonary effort is normal. No respiratory distress.     Breath sounds: Normal breath sounds. No stridor. No wheezing.  Abdominal:     General: Bowel sounds are normal.     Palpations: Abdomen is soft.           Assessment & Plan:  Benign essential HTN  Hypertriglyceridemia  Pure hypercholesterolemia  Prediabetes - Plan: Hemoglobin A1c, COMPLETE METABOLIC PANEL WITH GFR, Lipid Panel, Microalbumin, urine  Blood pressure is  well controlled today.  Recheck fasting lipid panel.  If triglycerides remain exceedingly high, I would discontinue Lipitor and switch to fenofibrate.  Monitor hemoglobin A1c.  Goal hemoglobin A1c is less than 6.5.  Encourage the patient to get 30 minutes a day 5 days a week of aerobic exercise.  Recommended less than 45 g of carbohydrates per meal.

## 2018-12-26 LAB — COMPLETE METABOLIC PANEL WITH GFR
AG Ratio: 2.2 (calc) (ref 1.0–2.5)
ALT: 31 U/L (ref 9–46)
AST: 18 U/L (ref 10–35)
Albumin: 4.3 g/dL (ref 3.6–5.1)
Alkaline phosphatase (APISO): 78 U/L (ref 35–144)
BUN: 17 mg/dL (ref 7–25)
CO2: 28 mmol/L (ref 20–32)
Calcium: 9.3 mg/dL (ref 8.6–10.3)
Chloride: 107 mmol/L (ref 98–110)
Creat: 1.05 mg/dL (ref 0.70–1.25)
GFR, Est African American: 89 mL/min/{1.73_m2} (ref >=60)
GFR, Est Non African American: 77 mL/min/{1.73_m2} (ref >=60)
Globulin: 2 g/dL (calc) (ref 1.9–3.7)
Glucose, Bld: 115 mg/dL — ABNORMAL HIGH (ref 65–99)
Potassium: 4.7 mmol/L (ref 3.5–5.3)
Sodium: 144 mmol/L (ref 135–146)
Total Bilirubin: 0.7 mg/dL (ref 0.2–1.2)
Total Protein: 6.3 g/dL (ref 6.1–8.1)

## 2018-12-26 LAB — HEMOGLOBIN A1C
Hgb A1c MFr Bld: 6 % of total Hgb — ABNORMAL HIGH (ref <5.7)
Mean Plasma Glucose: 126 (calc)
eAG (mmol/L): 7 (calc)

## 2018-12-26 LAB — LIPID PANEL
Cholesterol: 183 mg/dL (ref <200)
HDL: 34 mg/dL — ABNORMAL LOW (ref >=40)
LDL Cholesterol (Calc): 110 mg/dL (calc) — ABNORMAL HIGH
Non-HDL Cholesterol (Calc): 149 mg/dL (calc) — ABNORMAL HIGH (ref <130)
Total CHOL/HDL Ratio: 5.4 (calc) — ABNORMAL HIGH (ref <5.0)
Triglycerides: 294 mg/dL — ABNORMAL HIGH (ref <150)

## 2018-12-27 ENCOUNTER — Other Ambulatory Visit: Payer: Self-pay | Admitting: Family Medicine

## 2018-12-28 ENCOUNTER — Telehealth: Payer: Self-pay | Admitting: Family Medicine

## 2018-12-28 MED ORDER — VASCEPA 1 G PO CAPS: 2.0000 g | ORAL_CAPSULE | Freq: Two times a day (BID) | ORAL | 3 refills | Status: DC

## 2018-12-28 NOTE — Telephone Encounter (Signed)
PA Submitted through [email or web address] and received the following:  Your information has been submitted to [facility]. To check for an updated outcome later, reopen this PA request from your dashboard.  If [facility] has not responded to your request within 24 hours, contact [place]in Massie at [phone]. If you think there may be a problem with your PA request, use our live chat feature at the bottom right.

## 2018-12-31 ENCOUNTER — Other Ambulatory Visit: Payer: Self-pay | Admitting: Cardiology

## 2018-12-31 DIAGNOSIS — R0789 Other chest pain: Secondary | ICD-10-CM

## 2018-12-31 MED ORDER — ICOSAPENT ETHYL 1 G PO CAPS: 2.0000 g | ORAL_CAPSULE | Freq: Two times a day (BID) | ORAL | 3 refills | Status: DC

## 2018-12-31 NOTE — Addendum Note (Signed)
Addended by: [name] on: 12/31/2018 04:12 PM   Modules accepted: Orders

## 2018-12-31 NOTE — Telephone Encounter (Signed)
Approved 12/28/2018 - 12/27/2021  Pharm made aware

## 2019-01-02 ENCOUNTER — Encounter (HOSPITAL_COMMUNITY): Payer: Self-pay

## 2019-01-02 ENCOUNTER — Telehealth (HOSPITAL_COMMUNITY): Payer: Self-pay | Admitting: Emergency Medicine

## 2019-01-02 NOTE — Telephone Encounter (Signed)
Reaching out to patient to offer assistance regarding upcoming cardiac imaging study; pt verbalizes understanding of appt date/time, parking situation and where to check in, pre-test NPO status and medications ordered, and verified current allergies; name and call back number provided for further questions should they arise Aleric Froelich RN Navigator Cardiac Imaging [facility] [phone] office [phone] cell 

## 2019-01-03 ENCOUNTER — Ambulatory Visit
Admission: RE | Admit: 2019-01-03 | Discharge: 2019-01-03 | Disposition: A | Discharge status: Home / Self Care | Payer: BC Managed Care – PPO | Source: Ambulatory Visit | Attending: Cardiology | Admitting: Cardiology

## 2019-01-03 ENCOUNTER — Other Ambulatory Visit: Payer: Self-pay

## 2019-01-03 DIAGNOSIS — R0789 Other chest pain: Secondary | ICD-10-CM | POA: Diagnosis not present

## 2019-01-03 DIAGNOSIS — Z8249 Family history of ischemic heart disease and other diseases of the circulatory system: Secondary | ICD-10-CM | POA: Diagnosis present

## 2019-01-03 HISTORY — DX: Essential (primary) hypertension: I10

## 2019-01-03 MED ORDER — NITROGLYCERIN 0.4 MG SL SUBL
0.8000 mg | SUBLINGUAL_TABLET | Freq: Once | SUBLINGUAL | Status: AC
  Administered 2019-01-03: 0.8 mg via SUBLINGUAL

## 2019-01-03 MED ORDER — IOHEXOL 350 MG/ML SOLN
75.0000 mL | Freq: Once | INTRAVENOUS | Status: AC | PRN
  Administered 2019-01-03: 75 mL via INTRAVENOUS

## 2019-01-03 MED ORDER — METOPROLOL TARTRATE 5 MG/5ML IV SOLN
5.0000 mg | INTRAVENOUS | Status: DC | PRN
  Administered 2019-01-03 (×2): 5 mg via INTRAVENOUS

## 2019-01-03 NOTE — Progress Notes (Signed)
Patient tolerated CT without incident. Drank coffee after. Ambulated to exit steady gait.

## 2019-01-15 DIAGNOSIS — I2510 Atherosclerotic heart disease of native coronary artery without angina pectoris: Secondary | ICD-10-CM | POA: Insufficient documentation

## 2019-01-15 NOTE — Progress Notes (Signed)
Follow up visit  Subjective:   Nathaniel Porter, male    DOB: February 27, 1960, 58 y.o.   MRN: [id number]   HPI  58 y.o. Caucasian male with hypertension, hypertriglyceridemia, atypical chest pain.  CCTA showed mild nonobstructive CAD, that does not explain his chest pain. His TG remains elevated, even after stopping HCTZ. He is not taking Vascepa 2 g bid. He denies chest pain, shortness of breath, palpitations, leg edema, orthopnea, PND, TIA/syncope.     Current Outpatient Medications on File Prior to Visit  Medication Sig Dispense Refill  . Garlic 0998 MG CAPS Take by mouth.    . icosapent Ethyl (VASCEPA) 1 g capsule Take 2 capsules (2 g total) by mouth 2 (two) times daily. 120 capsule 3  . metoprolol succinate (TOPROL-XL) 50 MG 24 hr tablet Take 1 tablet (50 mg total) by mouth daily. Take with or immediately following a meal. 30 tablet 3  . omeprazole (PRILOSEC) 20 MG capsule TAKE ONE CAPSULE BY MOUTH EVERY DAY (Patient taking differently: Take 40 mg by mouth 2 (two) times daily before a meal. ) 30 capsule 11  . vitamin E 100 UNIT capsule Take by mouth daily.     No current facility-administered medications on file prior to visit.    Cardiovascular & other pertient studies:  CTA coronary 01/03/2019: 1. Coronary calcium score of 120. This was 85 percentile for age and sex matched control. 2. Normal coronary origin with right dominance. 3. Mild non obstructive calcified plaque in the proximal and mid LCx. 4.  Minimal non obstructive plaque in the proximal RCA 5. CAD-RADS 2. Mild non-obstructive CAD (25-49%). Consider non-atherosclerotic causes of chest pain. Consider preventive therapy and risk factor modification.   EKG 11/30/2018: Sinus rhythm 74 bpm. Normal EKG.  Recent labs: Results for TRAMANE, GORUM (MRN [id number]) as of 01/15/2019 16:43  Ref. Range 01/09/2017 08:33 07/19/2017 08:09 09/19/2018 08:15 12/25/2018 08:45  Total CHOL/HDL Ratio Latest Ref Range: <5.0 (calc)  5.1 (H) 4.8 6.4 (H) 5.4 (H)  Cholesterol Latest Ref Range: <200 mg/dL 174 198 218 (H) 183  HDL Cholesterol Latest Ref Range: > OR = 40 mg/dL 34 (L) 41 34 (L) 34 (L)  LDL Cholesterol (Calc) Latest Units: mg/dL (calc) 103 (H) 118 (H) Pend 110 (H)  Non-HDL Cholesterol (Calc) Latest Ref Range: <130 mg/dL (calc) 140 (H) 157 (H) 184 (H) 149 (H)  Triglycerides Latest Ref Range: <150 mg/dL 261 (H) 295 (H) 550 (H) 294 (H)    11/30/2018: Glucose 117, BUN/Cr 18/1.13. EGFR 85. Na/K 139/3.8. Rest of the CMP normal H/H 15/46. MCV 88. Platelets 260    Review of Systems  Constitution: Negative for decreased appetite, malaise/fatigue, weight gain and weight loss.  HENT: Negative for congestion.   Eyes: Negative for visual disturbance.  Cardiovascular: Negative for chest pain, dyspnea on exertion, leg swelling, palpitations and syncope.  Respiratory: Negative for cough.   Endocrine: Negative for cold intolerance.  Hematologic/Lymphatic: Does not bruise/bleed easily.  Skin: Negative for itching and rash.  Musculoskeletal: Positive for joint pain. Negative for myalgias.  Gastrointestinal: Negative for abdominal pain, nausea and vomiting.  Genitourinary: Negative for dysuria.  Neurological: Negative for dizziness and weakness.  Psychiatric/Behavioral: The patient is not nervous/anxious.   All other systems reviewed and are negative.       Vitals:   01/11/19 1458  BP: 116/83  Pulse: 71     Objective:   Physical Exam  Constitutional: He is oriented to person, place, and time. He appears well-developed  and well-nourished. No distress.  HENT:  Head: Normocephalic and atraumatic.  Eyes: Pupils are equal, round, and reactive to light. Conjunctivae are normal.  Neck: No JVD present.  Cardiovascular: Normal rate, regular rhythm and intact distal pulses.  Pulmonary/Chest: Effort normal and breath sounds normal. He has no wheezes. He has no rales.  Abdominal: Soft. Bowel sounds are normal. There is  no rebound.  Musculoskeletal:        General: No edema.  Lymphadenopathy:    He has no cervical adenopathy.  Neurological: He is alert and oriented to person, place, and time. No cranial nerve deficit.  Skin: Skin is warm and dry.  Psychiatric: He has a normal mood and affect.  Nursing note and vitals reviewed.         Assessment & Recommendations:   58 y.o. Caucasian male with hypertension, hypertriglyceridemia, atypical chest pain.  1. 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 Chest pain is noncardiac.   2. Hyperlipidemia: Increase atorvastatin to 80 mg daily. His symptoms are likely arthralgias and not myalgias. Continue Vascepa 2 g bid. Repeat lipid panel in 3 months.  3. Hypertension: Controlled  F/u in 3 months   Mylo, MD [facility]. [facility] Pager: [phone] Office: [phone]

## 2019-01-16 ENCOUNTER — Other Ambulatory Visit: Payer: Self-pay

## 2019-01-16 ENCOUNTER — Telehealth: Payer: BC Managed Care – PPO | Admitting: Cardiology

## 2019-01-16 VITALS — BP 116/83 | HR 71

## 2019-01-16 DIAGNOSIS — E782 Mixed hyperlipidemia: Secondary | ICD-10-CM | POA: Diagnosis not present

## 2019-01-16 DIAGNOSIS — R072 Precordial pain: Secondary | ICD-10-CM

## 2019-01-16 DIAGNOSIS — I2510 Atherosclerotic heart disease of native coronary artery without angina pectoris: Secondary | ICD-10-CM | POA: Diagnosis not present

## 2019-01-16 MED ORDER — ATORVASTATIN CALCIUM 80 MG PO TABS: 80.0000 mg | ORAL_TABLET | Freq: Every day | ORAL | 3 refills | Status: DC

## 2019-02-01 ENCOUNTER — Telehealth: Payer: Self-pay | Admitting: Family Medicine

## 2019-02-01 NOTE — Telephone Encounter (Signed)
Pt called and states that his BP has remained slightly elevated and wanted to know if his BP medication needs to be adjusted? Average is 130/88 but today it was 140/89 and he was understanding it should be around 120/80.

## 2019-02-04 NOTE — Telephone Encounter (Signed)
I believe these are still acceptable.  Goal is to keep his BP < 140/90

## 2019-02-05 NOTE — Telephone Encounter (Signed)
Pt aware.

## 2019-02-19 ENCOUNTER — Other Ambulatory Visit: Payer: Self-pay

## 2019-02-19 ENCOUNTER — Ambulatory Visit: Payer: BC Managed Care – PPO | Admitting: Family Medicine

## 2019-02-19 ENCOUNTER — Encounter: Payer: Self-pay | Admitting: Family Medicine

## 2019-02-19 VITALS — BP 126/78 | HR 76 | Temp 97.5°F | Resp 16 | Ht 67.0 in | Wt 167.0 lb

## 2019-02-19 DIAGNOSIS — R1031 Right lower quadrant pain: Secondary | ICD-10-CM | POA: Diagnosis not present

## 2019-02-19 NOTE — Progress Notes (Signed)
Subjective:    Patient ID: Nathaniel Porter, male    DOB: 1960-09-28, 59 y.o.   MRN: [id number]  HPI  Patient reports a 4-week history of pain in his right lower quadrant.  The pain is present on a daily basis.  There are no exacerbating or alleviating factors.  It will come and go.  When the pain is present it is a pressure/sharp pain that can last hours.  At other times the pain will improve and subside.  The pain is not made worse by movement.  Flexion and extension of the hip does not cause any pain.  Sit ups do not elicit pain.  Standing does however elicit pain.  Also palpation in that area elicits pain.  The pain is located in the area of the abdomen above McBurney's point but below the right upper quadrant.  There is no rash in that area.  There is no palpable mass or swelling.  There is no inguinal hernia on exam although the patient has a history of an inguinal hernia in that area.  The patient denies any hematuria or dysuria or urgency or frequency.  There is no testicular pain.  There is no nausea vomiting or diarrhea.  There is no melena or hematochezia.  Past Medical History:  Diagnosis Date  . Barrett esophagus   . BCC (basal cell carcinoma)   . Cerebral cyst   . Chronic otitis media of right ear   . GERD (gastroesophageal reflux disease)   . H/O hematuria   . 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 Hiatal hernia   . High cholesterol   . Hx MRSA infection    nose  . Hypertension   . Prediabetes   . Syncope    Past Surgical History:  Procedure Laterality Date  . HERNIA REPAIR Right 01/2008  . MOLE SURGERY  2014   basil cell  . NOSE SURGERY     x 3-4   Current Outpatient Medications on File Prior to Visit  Medication Sig Dispense Refill  . atorvastatin (LIPITOR) 80 MG tablet Take 1 tablet (80 mg total) by mouth daily at 6 PM. 90 tablet 3  . Garlic 123XX123 MG CAPS Take by mouth.    . icosapent Ethyl (VASCEPA) 1 g capsule Take 2 capsules (2 g total) by mouth 2 (two) times daily. 120  capsule 3  . metoprolol succinate (TOPROL-XL) 50 MG 24 hr tablet Take 1 tablet (50 mg total) by mouth daily. Take with or immediately following a meal. 30 tablet 3  . omeprazole (PRILOSEC) 20 MG capsule TAKE ONE CAPSULE BY MOUTH EVERY DAY (Patient taking differently: 2 qam and 1 qpm) 30 capsule 11  . vitamin E 100 UNIT capsule Take by mouth daily.     No current facility-administered medications on file prior to visit.   Allergies  Allergen Reactions  . Amoxicillin Anaphylaxis    REACTION: Rash/shortness of breath  . Codeine     REACTION: iritability  . Oxycodone-Acetaminophen      Can not take 5-500mg  causes chest tightness and pain   . Penicillins    Social History   Socioeconomic History  . Marital status: Married    Spouse name: Not on file  . Number of children: 2  . Years of education: 73  . Highest education level: Not on file  Occupational History    Employer: [employer]  Tobacco Use  . Smoking status: Never Smoker  . Smokeless tobacco: Never Used  Substance  and Sexual Activity  . Alcohol use: No  . Drug use: No  . Sexual activity: Not on file  Other Topics Concern  . Not on file  Social History Narrative   Patient lives at home with his wife . 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 He denies smoking, drinking.   Social Determinants of Health   Financial Resource Strain:   . Difficulty of Paying Living Expenses: Not on file  Food Insecurity:   . Worried About Charity fundraiser in the Last Year: Not on file  . Ran Out of Food in the Last Year: Not on file  Transportation Needs:   . Lack of Transportation (Medical): Not on file  . Lack of Transportation (Non-Medical): Not on file  Physical Activity:   . Days of Exercise per Week: Not on file  . Minutes of Exercise per Session: Not on file  Stress:   . Feeling of Stress : Not on file  Social Connections:   . Frequency of  Communication with Friends and Family: Not on file  . Frequency of Social Gatherings with Friends and Family: Not on file  . Attends Religious Services: Not on file  . Active Member of Clubs or Organizations: Not on file  . Attends Archivist Meetings: Not on file  . Marital Status: Not on file  Intimate Partner Violence:   . Fear of Current or Ex-Partner: Not on file  . Emotionally Abused: Not on file  . Physically Abused: Not on file  . Sexually Abused: Not on file      Review of Systems  All other systems reviewed and are negative.      Objective:   Physical Exam Vitals reviewed.  Constitutional:      Appearance: He is well-developed.  Cardiovascular:     Rate and Rhythm: Normal rate and regular rhythm.     Heart sounds: Normal heart sounds.  Pulmonary:     Effort: Pulmonary effort is normal. No respiratory distress.     Breath sounds: Normal breath sounds. No stridor. No wheezing.  Abdominal:     General: Bowel sounds are normal.     Palpations: Abdomen is soft.     Tenderness: There is abdominal tenderness in the right lower quadrant. There is no right CVA tenderness, left CVA tenderness, guarding or rebound. Negative signs include Murphy's sign and McBurney's sign.     Hernia: No hernia is present. There is no hernia in the left inguinal area or right inguinal area.             Assessment & Plan:  Abdominal pain, RLQ (right lower quadrant)  Right lower quadrant abdominal pain - Plan: CT Abdomen Pelvis W Contrast  Patient reports constant 5-10 pain in the right lower quadrant with no other exacerbating or alleviating factors.  The pain has been present now for weeks.  I am uncertain of the diagnosis and therefore I feel the patient would benefit from a CT scan of the abdomen pelvis to evaluate further.  I am concerned about possible abdominal adhesions potentially causing the pain from his previous hernia surgery.  Await the results of the CT scan.

## 2019-02-22 ENCOUNTER — Other Ambulatory Visit: Payer: Self-pay | Admitting: Family Medicine

## 2019-02-22 DIAGNOSIS — R1031 Right lower quadrant pain: Secondary | ICD-10-CM

## 2019-03-02 ENCOUNTER — Other Ambulatory Visit: Payer: Self-pay | Admitting: Family Medicine

## 2019-04-01 ENCOUNTER — Other Ambulatory Visit: Payer: Self-pay | Admitting: Cardiology

## 2019-04-01 DIAGNOSIS — I10 Essential (primary) hypertension: Secondary | ICD-10-CM

## 2019-04-01 DIAGNOSIS — R0789 Other chest pain: Secondary | ICD-10-CM

## 2019-04-03 LAB — LIPID PANEL
Chol/HDL Ratio: 4.4 ratio (ref 0.0–5.0)
Cholesterol, Total: 155 mg/dL (ref 100–199)
HDL: 35 mg/dL — ABNORMAL LOW (ref >39)
LDL Chol Calc (NIH): 93 mg/dL (ref 0–99)
Triglycerides: 152 mg/dL — ABNORMAL HIGH (ref 0–149)
VLDL Cholesterol Cal: 27 mg/dL (ref 5–40)

## 2019-04-03 LAB — BASIC METABOLIC PANEL
BUN/Creatinine Ratio: 17 (ref 10–24)
BUN: 17 mg/dL (ref 8–27)
CO2: 23 mmol/L (ref 20–29)
Calcium: 9.1 mg/dL (ref 8.6–10.2)
Chloride: 106 mmol/L (ref 96–106)
Creatinine, Ser: 0.98 mg/dL (ref 0.76–1.27)
GFR calc Af Amer: 96 mL/min/{1.73_m2} (ref >59)
GFR calc non Af Amer: 83 mL/min/{1.73_m2} (ref >59)
Glucose: 119 mg/dL — ABNORMAL HIGH (ref 65–99)
Potassium: 4 mmol/L (ref 3.5–5.2)
Sodium: 145 mmol/L — ABNORMAL HIGH (ref 134–144)

## 2019-04-18 ENCOUNTER — Ambulatory Visit: Payer: BC Managed Care – PPO | Admitting: Cardiology

## 2019-04-22 ENCOUNTER — Ambulatory Visit: Payer: BC Managed Care – PPO | Admitting: Cardiology

## 2019-04-24 ENCOUNTER — Other Ambulatory Visit: Payer: Self-pay | Admitting: Cardiology

## 2019-04-24 DIAGNOSIS — R0789 Other chest pain: Secondary | ICD-10-CM

## 2019-04-24 DIAGNOSIS — I10 Essential (primary) hypertension: Secondary | ICD-10-CM

## 2019-05-02 NOTE — Progress Notes (Signed)
Follow up visit  Subjective:   Nathaniel Porter, male    DOB: May 10, 1960, 59 y.o.   MRN: [id number]   HPI  59 y.o. Caucasian male with hypertension, hypertriglyceridemia, atypical chest pain.  CCTA showed mild nonobstructive CAD, that does not explain his chest pain. His TG and LDL have improved, details below.   He denies chest pain, shortness of breath, palpitations, leg edema, orthopnea, PND, TIA/syncope. He is getting moire active, playing basketball etc, as weather gets warmer.   He has noticed his BP is staying around 140/80 mmHg.     Current Outpatient Medications on File Prior to Visit  Medication Sig Dispense Refill  . atorvastatin (LIPITOR) 80 MG tablet Take 1 tablet (80 mg total) by mouth daily at 6 PM. 90 tablet 3  . dicyclomine (BENTYL) 10 MG capsule Take 10 mg by mouth 3 (three) times daily as needed.    . fluticasone (FLONASE) 50 MCG/ACT nasal spray SMARTSIG:2 Spray(s) Both Nares Every Night    . Garlic 3875 MG CAPS Take by mouth.    . icosapent Ethyl (VASCEPA) 1 g capsule Take 2 capsules (2 g total) by mouth 2 (two) times daily. 120 capsule 3  . metoprolol succinate (TOPROL-XL) 50 MG 24 hr tablet TAKE 1 TABLET (50 MG TOTAL) BY MOUTH DAILY. TAKE WITH OR IMMEDIATELY FOLLOWING A MEAL. 90 tablet 1  . omeprazole (PRILOSEC) 20 MG capsule TAKE ONE CAPSULE BY MOUTH EVERY DAY (Patient taking differently: 2 qam and 1 qpm) 30 capsule 11  . vitamin E 100 UNIT capsule Take by mouth daily.     No current facility-administered medications on file prior to visit.    Cardiovascular & other pertient studies:  CTA coronary 01/03/2019: 1. Coronary calcium score of 120. This was 11 percentile for age and sex matched control. 2. Normal coronary origin with right dominance. 3. Mild non obstructive calcified plaque in the proximal and mid LCx. 4.  Minimal non obstructive plaque in the proximal RCA 5. CAD-RADS 2. Mild non-obstructive CAD (25-49%). Consider non-atherosclerotic  causes of chest pain. Consider preventive therapy and risk factor modification.   EKG 11/30/2018: Sinus rhythm 74 bpm. Normal EKG.  Recent labs: 04/02/2019: Glucose 119, BUN/Cr 17/0.98. EGFR 83. Na/K 145/4.0.  HbA1C 6.0% Chol 183, TG 294, HDL 34, LDL 110  12/25/2018: Chol 155, TG 152, HDL 35, LDL 93  11/30/2018: Glucose 117, BUN/Cr 18/1.13. EGFR 85. Na/K 139/3.8. Rest of the CMP normal H/H 15/46. MCV 88. Platelets 260    Review of Systems  Cardiovascular: Negative for chest pain, dyspnea on exertion, leg swelling, palpitations and syncope.        Vitals:   05/03/19 0956 05/03/19 1004  BP: (!) 142/90 (!) 141/88  Pulse: 75 73  Resp: 16   Temp: (!) 97.3 F (36.3 C)   SpO2: 99% 100%     Objective:   Physical Exam  Constitutional: He appears well-developed and well-nourished.  Neck: No JVD present.  Cardiovascular: Normal rate, regular rhythm, normal heart sounds and intact distal pulses.  No murmur heard. Pulmonary/Chest: Effort normal and breath sounds normal. He has no wheezes. He has no rales.  Musculoskeletal:        General: No edema.  Nursing note and vitals reviewed.         Assessment & Recommendations:   59 y.o. Caucasian male with hypertension, hypertriglyceridemia, atypical chest pain.  1. 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 Chest pain is noncardiac.   2. Hyperlipidemia: LDL down to  93, TG down to 152 on atorvastatin 80 mg daily and Vascepa 2 g bid. Continue the same for now. Repeat lipid panel in 3 months. If LDL remains >79mcould add Zetia.  3. Hypertension: Recently uncontrolled. Added amlodipine 5 mg back. He reportedly had chest pains with 10 mg, but willing to try 5 mg. He has had cough with lisinopril, chest pains with losartan in the past.  F/u in 3 months   Lyrick Lagrand JEsther Hardy MD PAd[facility] HospitalCardi[facility]. [facility] Pager: [phone]: [phone]

## 2019-05-03 ENCOUNTER — Other Ambulatory Visit: Payer: Self-pay

## 2019-05-03 ENCOUNTER — Ambulatory Visit: Payer: BC Managed Care – PPO | Admitting: Cardiology

## 2019-05-03 ENCOUNTER — Encounter: Payer: Self-pay | Admitting: Cardiology

## 2019-05-03 VITALS — BP 141/88 | HR 73 | Temp 97.3°F | Resp 16 | Ht 67.0 in | Wt 168.8 lb

## 2019-05-03 DIAGNOSIS — I2510 Atherosclerotic heart disease of native coronary artery without angina pectoris: Secondary | ICD-10-CM

## 2019-05-03 DIAGNOSIS — R0789 Other chest pain: Secondary | ICD-10-CM

## 2019-05-03 DIAGNOSIS — E781 Pure hyperglyceridemia: Secondary | ICD-10-CM

## 2019-05-03 DIAGNOSIS — I10 Essential (primary) hypertension: Secondary | ICD-10-CM

## 2019-05-03 DIAGNOSIS — E782 Mixed hyperlipidemia: Secondary | ICD-10-CM

## 2019-05-03 MED ORDER — AMLODIPINE BESYLATE 5 MG PO TABS: 5.0000 mg | ORAL_TABLET | Freq: Every day | ORAL | 2 refills | Status: DC

## 2019-05-18 ENCOUNTER — Other Ambulatory Visit: Payer: Self-pay | Admitting: Cardiology

## 2019-05-18 DIAGNOSIS — R0789 Other chest pain: Secondary | ICD-10-CM

## 2019-05-18 DIAGNOSIS — I10 Essential (primary) hypertension: Secondary | ICD-10-CM

## 2019-06-18 ENCOUNTER — Other Ambulatory Visit: Payer: Self-pay | Admitting: Cardiology

## 2019-06-18 DIAGNOSIS — I10 Essential (primary) hypertension: Secondary | ICD-10-CM

## 2019-06-18 DIAGNOSIS — R0789 Other chest pain: Secondary | ICD-10-CM

## 2019-07-08 ENCOUNTER — Other Ambulatory Visit: Payer: Self-pay | Admitting: Cardiology

## 2019-07-08 DIAGNOSIS — R0789 Other chest pain: Secondary | ICD-10-CM

## 2019-07-08 DIAGNOSIS — I10 Essential (primary) hypertension: Secondary | ICD-10-CM

## 2019-08-03 LAB — LIPID PANEL
Chol/HDL Ratio: 6.1 ratio — ABNORMAL HIGH (ref 0.0–5.0)
Cholesterol, Total: 194 mg/dL (ref 100–199)
HDL: 32 mg/dL — ABNORMAL LOW (ref >39)
LDL Chol Calc (NIH): 109 mg/dL — ABNORMAL HIGH (ref 0–99)
Triglycerides: 310 mg/dL — ABNORMAL HIGH (ref 0–149)
VLDL Cholesterol Cal: 53 mg/dL — ABNORMAL HIGH (ref 5–40)

## 2019-08-12 ENCOUNTER — Other Ambulatory Visit: Payer: Self-pay | Admitting: Family Medicine

## 2019-09-05 ENCOUNTER — Ambulatory Visit: Payer: BC Managed Care – PPO | Admitting: Cardiology

## 2019-09-09 ENCOUNTER — Encounter: Payer: Self-pay | Admitting: Cardiology

## 2019-09-09 ENCOUNTER — Other Ambulatory Visit: Payer: Self-pay

## 2019-09-09 ENCOUNTER — Ambulatory Visit: Payer: BC Managed Care – PPO | Admitting: Cardiology

## 2019-09-09 VITALS — BP 118/75 | HR 70 | Resp 17 | Ht 67.0 in | Wt 170.0 lb

## 2019-09-09 DIAGNOSIS — E782 Mixed hyperlipidemia: Secondary | ICD-10-CM

## 2019-09-09 DIAGNOSIS — I10 Essential (primary) hypertension: Secondary | ICD-10-CM

## 2019-09-09 DIAGNOSIS — I2510 Atherosclerotic heart disease of native coronary artery without angina pectoris: Secondary | ICD-10-CM

## 2019-09-09 MED ORDER — EZETIMIBE 10 MG PO TABS: 10.0000 mg | ORAL_TABLET | Freq: Every day | ORAL | 2 refills | Status: DC

## 2019-09-09 NOTE — Progress Notes (Signed)
Follow up visit  Subjective:   Nathaniel Porter, male    DOB: Jul 15, 1960, 59 y.o.   MRN: [id number]   HPI  59 y.o. Caucasian male with hypertension, hypertriglyceridemia, nonobstructive CAD  Patient continues to have chest tightness similar to his initial presentation.  This is not consistent with physical exertion.  He rides bicycle with his son for 30 minutes without any significant chest pain symptoms.  Recent lipid panel results reviewed with the patient.  Patient is compliant with atorvastatin 80 mg, and Vascepa 2 g twice daily.  Current Outpatient Medications on File Prior to Visit  Medication Sig Dispense Refill  . amLODipine (NORVASC) 5 MG tablet Take 1 tablet (5 mg total) by mouth daily. 60 tablet 2  . atorvastatin (LIPITOR) 80 MG tablet Take 1 tablet (80 mg total) by mouth daily at 6 PM. 90 tablet 3  . dicyclomine (BENTYL) 10 MG capsule Take 10 mg by mouth 3 (three) times daily as needed.    . fluticasone (FLONASE) 50 MCG/ACT nasal spray SMARTSIG:2 Spray(s) Both Nares Every Night    . Garlic 1660 MG CAPS Take by mouth.    . metoprolol succinate (TOPROL-XL) 50 MG 24 hr tablet TAKE 1 TABLET (50 MG TOTAL) BY MOUTH DAILY. TAKE WITH OR IMMEDIATELY FOLLOWING A MEAL. 270 tablet 1  . omeprazole (PRILOSEC) 20 MG capsule TAKE ONE CAPSULE BY MOUTH EVERY DAY (Patient taking differently: 2 qam and 1 qpm) 30 capsule 11  . VASCEPA 1 g capsule TAKE 2 CAPSULES (2 G TOTAL) BY MOUTH 2 (TWO) TIMES DAILY. 120 capsule 3  . vitamin E 100 UNIT capsule Take by mouth daily.     No current facility-administered medications on file prior to visit.    Cardiovascular & other pertient studies:  CTA coronary 01/03/2019: 1. Coronary calcium score of 120. This was 56 percentile for age and sex matched control. 2. Normal coronary origin with right dominance. 3. Mild non obstructive calcified plaque in the proximal and mid LCx. 4.  Minimal non obstructive plaque in the proximal RCA 5. CAD-RADS 2. Mild  non-obstructive CAD (25-49%). Consider non-atherosclerotic causes of chest pain. Consider preventive therapy and risk factor modification.   EKG 11/30/2018: Sinus rhythm 74 bpm. Normal EKG.  Recent labs: 08/02/2019: Chol 194, TG 310, HDL 32, LDL 109  04/02/2019: Glucose 119, BUN/Cr 17/0.98. EGFR 83. Na/K 145/4.0.  HbA1C 6.0% Chol 183, TG 294, HDL 34, LDL 110  12/25/2018: Chol 155, TG 152, HDL 35, LDL 93  11/30/2018: Glucose 117, BUN/Cr 18/1.13. EGFR 85. Na/K 139/3.8. Rest of the CMP normal H/H 15/46. MCV 88. Platelets 260    Review of Systems  Cardiovascular: Negative for chest pain, dyspnea on exertion, leg swelling, palpitations and syncope.        Vitals:   09/09/19 1013  BP: 118/75  Pulse: 70  Resp: 17  SpO2: 99%     Objective:   Physical Exam Vitals and nursing note reviewed.  Constitutional:      Appearance: He is well-developed.  Neck:     Vascular: No JVD.  Cardiovascular:     Rate and Rhythm: Normal rate and regular rhythm.     Pulses: Intact distal pulses.     Heart sounds: Normal heart sounds. No murmur heard.   Pulmonary:     Effort: Pulmonary effort is normal.     Breath sounds: Normal breath sounds. No wheezing or rales.           Assessment & Recommendations:  59 y.o. Caucasian male with hypertension, hypertriglyceridemia, nonobstructive CAD  Atypical chest pain: Nonobstructive CAD on CCTA. Chest pain is noncardiac.   Hyperlipidemia: LDL 109, TG 310 Compliant with Lipitor 80 mg, Vascepa 2 g bid. I discussed addition of Repatha. He would like to avoid parenteral therapy at this time, if possible. I have added Zetia 10 mg. Repeat lipid panel in 3 months.  Hypertension: He has had cough with lisinopril, chest pains with losartan in the past. Well controlled on amlodipine 5 mg  F/u in 3 months after repeat fasting lipid panel.   [name], MD [facility] Pager: [phone] Office:  [phone]

## 2019-09-18 ENCOUNTER — Ambulatory Visit: Payer: BC Managed Care – PPO | Admitting: Cardiology

## 2019-10-19 ENCOUNTER — Other Ambulatory Visit (INDEPENDENT_AMBULATORY_CARE_PROVIDER_SITE_OTHER): Payer: Self-pay | Admitting: Otolaryngology

## 2019-10-19 ENCOUNTER — Other Ambulatory Visit: Payer: Self-pay | Admitting: Cardiology

## 2019-10-19 DIAGNOSIS — I10 Essential (primary) hypertension: Secondary | ICD-10-CM

## 2019-10-25 ENCOUNTER — Other Ambulatory Visit: Payer: Self-pay | Admitting: Family Medicine

## 2019-10-25 MED ORDER — FLUTICASONE PROPIONATE 50 MCG/ACT NA SUSP: NASAL | 3 refills | Status: DC

## 2019-10-25 NOTE — Telephone Encounter (Signed)
Patient left vm requesting a refill on his flonase.  CB# [phone]

## 2019-10-25 NOTE — Telephone Encounter (Signed)
Prescription sent to pharmacy.

## 2019-11-23 ENCOUNTER — Other Ambulatory Visit: Payer: Self-pay | Admitting: Cardiology

## 2019-11-23 DIAGNOSIS — E782 Mixed hyperlipidemia: Secondary | ICD-10-CM

## 2019-12-07 LAB — LIPID PANEL
Chol/HDL Ratio: 4.5 ratio (ref 0.0–5.0)
Cholesterol, Total: 150 mg/dL (ref 100–199)
HDL: 33 mg/dL — ABNORMAL LOW (ref >39)
LDL Chol Calc (NIH): 92 mg/dL (ref 0–99)
Triglycerides: 143 mg/dL (ref 0–149)
VLDL Cholesterol Cal: 25 mg/dL (ref 5–40)

## 2019-12-18 ENCOUNTER — Other Ambulatory Visit: Payer: Self-pay

## 2019-12-18 ENCOUNTER — Ambulatory Visit: Payer: BC Managed Care – PPO | Admitting: Cardiology

## 2019-12-18 ENCOUNTER — Encounter: Payer: Self-pay | Admitting: Cardiology

## 2019-12-18 VITALS — BP 133/79 | HR 74 | Resp 16 | Ht 67.0 in | Wt 172.0 lb

## 2019-12-18 DIAGNOSIS — I10 Essential (primary) hypertension: Secondary | ICD-10-CM

## 2019-12-18 DIAGNOSIS — I2510 Atherosclerotic heart disease of native coronary artery without angina pectoris: Secondary | ICD-10-CM

## 2019-12-18 DIAGNOSIS — E782 Mixed hyperlipidemia: Secondary | ICD-10-CM

## 2019-12-18 NOTE — Progress Notes (Signed)
Follow up visit  Subjective:   Nathaniel Porter, male    DOB: August 04, 1960, 59 y.o.   MRN: [id number]   HPI  59 y.o. Caucasian male with hypertension, hypertriglyceridemia, nonobstructive CAD  Patient is doing well, denies chest pain, shortness of breath, palpitations, leg edema, orthopnea, PND, TIA/syncope. He bikes regularly with his son.   Recent lipid panel results reviewed with the patient, details below.  Current Outpatient Medications on File Prior to Visit  Medication Sig Dispense Refill  . amLODipine (NORVASC) 5 MG tablet TAKE 1 TABLET BY MOUTH EVERY DAY 90 tablet 1  . atorvastatin (LIPITOR) 80 MG tablet Take 1 tablet (80 mg total) by mouth daily at 6 PM. 90 tablet 3  . dicyclomine (BENTYL) 10 MG capsule Take 10 mg by mouth 3 (three) times daily as needed.    . ezetimibe (ZETIA) 10 MG tablet TAKE 1 TABLET BY MOUTH EVERY DAY 90 tablet 2  . fluticasone (FLONASE) 50 MCG/ACT nasal spray SMARTSIG:2 Spray(s) Both Nares Every Night 48 mL 3  . Garlic 6256 MG CAPS Take by mouth.    . metoprolol succinate (TOPROL-XL) 50 MG 24 hr tablet TAKE 1 TABLET (50 MG TOTAL) BY MOUTH DAILY. TAKE WITH OR IMMEDIATELY FOLLOWING A MEAL. 270 tablet 1  . omeprazole (PRILOSEC) 20 MG capsule TAKE ONE CAPSULE BY MOUTH EVERY DAY (Patient taking differently: 2 qam and 1 qpm) 30 capsule 11  . VASCEPA 1 g capsule TAKE 2 CAPSULES (2 G TOTAL) BY MOUTH 2 (TWO) TIMES DAILY. 120 capsule 3  . vitamin E 100 UNIT capsule Take by mouth daily.     No current facility-administered medications on file prior to visit.    Cardiovascular & other pertient studies:  EKG 12/18/2019: Sinus rhythm 71 bpm Normal EKG  CTA coronary 01/03/2019: 1. Coronary calcium score of 120. This was 66 percentile for age and sex matched control. 2. Normal coronary origin with right dominance. 3. Mild non obstructive calcified plaque in the proximal and mid LCx. 4.  Minimal non obstructive plaque in the proximal RCA 5. CAD-RADS 2. Mild  non-obstructive CAD (25-49%). Consider non-atherosclerotic causes of chest pain. Consider preventive therapy and risk factor modification.   EKG 11/30/2018: Sinus rhythm 74 bpm. Normal EKG.  Recent labs: 12/06/2019: Chol 150, TG 143, HDL 33, LDL 92  08/02/2019: Chol 194, TG 310, HDL 32, LDL 109  04/02/2019: Glucose 119, BUN/Cr 17/0.98. EGFR 83. Na/K 145/4.0.  HbA1C 6.0% Chol 183, TG 294, HDL 34, LDL 110  12/25/2018: Chol 155, TG 152, HDL 35, LDL 93  11/30/2018: Glucose 117, BUN/Cr 18/1.13. EGFR 85. Na/K 139/3.8. Rest of the CMP normal H/H 15/46. MCV 88. Platelets 260    Review of Systems  Cardiovascular: Negative for chest pain, dyspnea on exertion, leg swelling, palpitations and syncope.        Vitals:   12/18/19 0833  BP: 133/79  Pulse: 74  Resp: 16  SpO2: 97%     Objective:   Physical Exam Vitals and nursing note reviewed.  Constitutional:      Appearance: He is well-developed.  Neck:     Vascular: No JVD.  Cardiovascular:     Rate and Rhythm: Normal rate and regular rhythm.     Pulses: Intact distal pulses.     Heart sounds: Normal heart sounds. No murmur heard.   Pulmonary:     Effort: Pulmonary effort is normal.     Breath sounds: Normal breath sounds. No wheezing or rales.  Assessment & Recommendations:   59 y.o. Caucasian male with hypertension, hypertriglyceridemia, nonobstructive CAD  Atypical chest pain: Nonobstructive CAD on CCTA. Chest pain is noncardiac.   Hyperlipidemia: Improved. LDL 92, TG 143 Continue Lipitor 80 mg daily, Zetia 10 mg daily, Vascepa 2 g bid.  Hypertension: He has had cough with lisinopril, chest pains with losartan in the past. Well controlled on amlodipine 5 mg  F/u in 1 year after repeat fasting lipid panel.   Nathaniel Mormon, MD [facility] Pager: [phone] Office: [phone]

## 2019-12-30 ENCOUNTER — Other Ambulatory Visit: Payer: Self-pay | Admitting: Family Medicine

## 2020-03-03 ENCOUNTER — Other Ambulatory Visit: Payer: Self-pay | Admitting: Cardiology

## 2020-03-03 DIAGNOSIS — E782 Mixed hyperlipidemia: Secondary | ICD-10-CM

## 2020-03-03 DIAGNOSIS — I2510 Atherosclerotic heart disease of native coronary artery without angina pectoris: Secondary | ICD-10-CM

## 2020-03-10 ENCOUNTER — Other Ambulatory Visit: Payer: Self-pay

## 2020-03-10 ENCOUNTER — Encounter: Payer: Self-pay | Admitting: Family Medicine

## 2020-03-10 ENCOUNTER — Ambulatory Visit: Payer: BC Managed Care – PPO | Admitting: Family Medicine

## 2020-03-10 VITALS — BP 138/72 | HR 89 | Temp 97.6°F | Ht 67.0 in | Wt 173.0 lb

## 2020-03-10 DIAGNOSIS — R5382 Chronic fatigue, unspecified: Secondary | ICD-10-CM | POA: Diagnosis not present

## 2020-03-10 DIAGNOSIS — N433 Hydrocele, unspecified: Secondary | ICD-10-CM

## 2020-03-10 DIAGNOSIS — M7712 Lateral epicondylitis, left elbow: Secondary | ICD-10-CM

## 2020-03-10 MED ORDER — DICLOFENAC SODIUM 75 MG PO TBEC: 75.0000 mg | DELAYED_RELEASE_TABLET | Freq: Two times a day (BID) | ORAL | 0 refills | Status: DC

## 2020-03-10 NOTE — Progress Notes (Signed)
Subjective:    Patient ID: Nathaniel Porter, male    DOB: [birth year], 60 y.o.   MRN: [id number]  HPI  Patient is presents with a left elbow pain.  He states is been hurting off and on for 6 months.  The pain is located around the lateral epicondyle.  He denies any falls or trauma.  He has tenderness to palpation just distal to the lateral epicondyle which is made worse by squeezing motions of the hand, and wrist flexion and extension.  He also reports decreasing libido and chronic fatigue.  He is on a beta-blocker however his biggest concern is decreasing libido. Past Medical History:  Diagnosis Date  . Barrett esophagus   . BCC (basal cell carcinoma)   . Cerebral cyst   . Chronic otitis media of right ear   . GERD (gastroesophageal reflux disease)   . H/O hematuria   . 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 Hiatal hernia   . High cholesterol   . Hx MRSA infection    nose  . Hypertension   . Prediabetes   . Syncope    Past Surgical History:  Procedure Laterality Date  . HERNIA REPAIR Right 01/2008  . MOLE SURGERY  2014   basil cell  . NOSE SURGERY     x 3-4   Current Outpatient Medications on File Prior to Visit  Medication Sig Dispense Refill  . amLODipine (NORVASC) 5 MG tablet TAKE 1 TABLET BY MOUTH EVERY DAY 90 tablet 1  . atorvastatin (LIPITOR) 80 MG tablet TAKE 1 TABLET BY MOUTH EVERY DAY AT 6PM 90 tablet 3  . ezetimibe (ZETIA) 10 MG tablet TAKE 1 TABLET BY MOUTH EVERY DAY 90 tablet 2  . fluticasone (FLONASE) 50 MCG/ACT nasal spray SMARTSIG:2 Spray(s) Both Nares Every Night 48 mL 3  . Garlic 6314 MG CAPS Take by mouth.    Marland Kitchen omeprazole (PRILOSEC) 20 MG capsule TAKE ONE CAPSULE BY MOUTH EVERY DAY (Patient taking differently: 2 qam and 1 qpm) 30 capsule 11  . VASCEPA 1 g capsule TAKE 2 CAPSULES (2 G TOTAL) BY MOUTH 2 (TWO) TIMES DAILY. 120 capsule 3  . vitamin E 100 UNIT capsule Take by mouth daily.    . metoprolol succinate (TOPROL-XL) 50 MG 24 hr tablet TAKE 1 TABLET (50 MG TOTAL)  BY MOUTH DAILY. TAKE WITH OR IMMEDIATELY FOLLOWING A MEAL. 270 tablet 1   No current facility-administered medications on file prior to visit.   Allergies  Allergen Reactions  . Amoxicillin Anaphylaxis    REACTION: Rash/shortness of breath  . Codeine     REACTION: iritability  . Oxycodone-Acetaminophen      Can not take 5-500mg  causes chest tightness and pain   . Penicillins    Social History   Socioeconomic History  . Marital status: Married    Spouse name: Not on file  . Number of children: 2  . Years of education: 16  . Highest education level: Not on file  Occupational History    Employer: [employer]  Tobacco Use  . Smoking status: Never Smoker  . Smokeless tobacco: Never Used  Vaping Use  . Vaping Use: Never used  Substance and Sexual Activity  . Alcohol use: No  . Drug use: No  . Sexual activity: Not on file  Other Topics Concern  . Not on file  Social History Narrative   Patient lives at home with his wife . 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 He denies smoking, drinking.   Social Determinants of Health   Financial Resource Strain: Not on file  Food Insecurity: Not on file  Transportation Needs: Not on file  Physical Activity: Not on file  Stress: Not on file  Social Connections: Not on file  Intimate Partner Violence: Not on file      Review of Systems  All other systems reviewed and are negative.      Objective:   Physical Exam Vitals reviewed.  Constitutional:      Appearance: He is well-developed.  Cardiovascular:     Rate and Rhythm: Normal rate and regular rhythm.     Heart sounds: Normal heart sounds.  Pulmonary:     Effort: Pulmonary effort is normal. No respiratory distress.     Breath sounds: Normal breath sounds. No stridor. No wheezing.  Abdominal:     General: Bowel sounds are normal.     Palpations: Abdomen is soft.  Genitourinary:    Testes:         Right: Mass, tenderness or swelling not present.        Left: Testicular hydrocele present. Mass, tenderness or swelling not present.  Musculoskeletal:     Left elbow: No swelling or deformity. Normal range of motion. Tenderness present in lateral epicondyle.           Assessment & Plan:  Chronic fatigue - Plan: Testosterone Total,Free,Bio, Males  Lateral epicondylitis of left elbow  Hydrocele in adult  Given the low libido and fatigue, I will check a testosterone level.  Other possible contributors would be the fact he is on a beta-blocker.  We discussed options to treat his lateral epicondylitis including a cortisone injection versus NSAIDs and an elbow strap.  He would like to try NSAIDs and an elbow strap.  I will start him on diclofenac 75 mg twice daily and recommended a tennis elbow strap for the next 2 weeks.  If no better I would recommend an x-ray of the elbow as well as a cortisone injection.  During his exam, there is swelling in the left side of his scrotum.  It is a fluidlike swelling with no discernible mass.  I believe that he has a hydrocele.  We discussed a scrotal ultrasound to evaluate further but at the present time he plans to just monitor clinically

## 2020-03-11 LAB — TESTOSTERONE TOTAL,FREE,BIO, MALES
Albumin: 4.2 g/dL (ref 3.6–5.1)
Sex Hormone Binding: 27 nmol/L (ref 22–77)
Testosterone, Bioavailable: 74.3 ng/dL — ABNORMAL LOW (ref >=110.0)
Testosterone, Free: 38.6 pg/mL — ABNORMAL LOW (ref 46.0–224.0)
Testosterone: 258 ng/dL (ref 250–827)

## 2020-03-18 ENCOUNTER — Other Ambulatory Visit: Payer: Self-pay

## 2020-03-18 DIAGNOSIS — E291 Testicular hypofunction: Secondary | ICD-10-CM

## 2020-03-20 ENCOUNTER — Ambulatory Visit: Payer: BC Managed Care – PPO

## 2020-03-20 ENCOUNTER — Other Ambulatory Visit: Payer: Self-pay

## 2020-03-20 DIAGNOSIS — E291 Testicular hypofunction: Secondary | ICD-10-CM

## 2020-03-21 LAB — TESTOSTERONE TOTAL,FREE,BIO, MALES
Albumin: 4.4 g/dL (ref 3.6–5.1)
Sex Hormone Binding: 23 nmol/L (ref 22–77)
Testosterone, Bioavailable: 122.2 ng/dL (ref >=110.0)
Testosterone, Free: 60.7 pg/mL (ref 46.0–224.0)
Testosterone: 353 ng/dL (ref 250–827)

## 2020-03-21 LAB — PROLACTIN: Prolactin: 6.8 ng/mL (ref 2.0–18.0)

## 2020-03-24 ENCOUNTER — Other Ambulatory Visit: Payer: Self-pay | Admitting: Cardiology

## 2020-03-24 DIAGNOSIS — I10 Essential (primary) hypertension: Secondary | ICD-10-CM

## 2020-04-23 ENCOUNTER — Other Ambulatory Visit: Payer: Self-pay | Admitting: *Deleted

## 2020-04-23 MED ORDER — ICOSAPENT ETHYL 1 G PO CAPS: 2.0000 g | ORAL_CAPSULE | Freq: Two times a day (BID) | ORAL | 3 refills | Status: DC

## 2020-06-19 ENCOUNTER — Ambulatory Visit: Payer: BC Managed Care – PPO | Admitting: Nurse Practitioner

## 2020-07-10 ENCOUNTER — Ambulatory Visit: Payer: BC Managed Care – PPO | Admitting: Family Medicine

## 2020-07-10 ENCOUNTER — Encounter: Payer: Self-pay | Admitting: Family Medicine

## 2020-07-10 ENCOUNTER — Other Ambulatory Visit: Payer: Self-pay

## 2020-07-10 VITALS — BP 132/70 | HR 74 | Temp 98.5°F | Resp 16 | Ht 67.0 in | Wt 166.0 lb

## 2020-07-10 DIAGNOSIS — R1031 Right lower quadrant pain: Secondary | ICD-10-CM

## 2020-07-10 MED ORDER — NEOMYCIN-POLYMYXIN-HC 3.5-10000-1 OT SOLN: 3.0000 [drp] | Freq: Four times a day (QID) | OTIC | 0 refills | Status: AC

## 2020-07-10 NOTE — Progress Notes (Signed)
Subjective:    Patient ID: Nathaniel Porter, male    DOB: 1960-06-09, 60 y.o.   MRN: [id number]  HPI 02/19/19 Patient reports a 4-week history of pain in his right lower quadrant.  The pain is present on a daily basis.  There are no exacerbating or alleviating factors.  It will come and go.  When the pain is present it is a pressure/sharp pain that can last hours.  At other times the pain will improve and subside.  The pain is not made worse by movement.  Flexion and extension of the hip does not cause any pain.  Sit ups do not elicit pain.  Standing does however elicit pain.  Also palpation in that area elicits pain.  The pain is located in the area of the abdomen above McBurney's point but below the right upper quadrant.  There is no rash in that area.  There is no palpable mass or swelling.  There is no inguinal hernia on exam although the patient has a history of an inguinal hernia in that area.  The patient denies any hematuria or dysuria or urgency or frequency.  There is no testicular pain.  There is no nausea vomiting or diarrhea.  There is no melena or hematochezia.  AT that time, my plan was:  Patient reports constant 5-10 pain in the right lower quadrant with no other exacerbating or alleviating factors.  The pain has been present now for weeks.  I am uncertain of the diagnosis and therefore I feel the patient would benefit from a CT scan of the abdomen pelvis to evaluate further.  I am concerned about possible abdominal adhesions potentially causing the pain from his previous hernia surgery.  Await the results of the CT scan.  07/10/20 Saw GI, had CT- Impression:   1.  No acute findings to explain right lower quadrant pain.   2.  Status post right inguinal hernia repair with no evidence of herniarecurrence.   3.  Small fat-containing left inguinal hernia.   4.  Enlarged prostate gland.    Patient states that he is having pain in his right lower quadrant again.  The pain is located in the  exact same area was hurting before.  It is located above McBurney's point in the right mid to lower abdomen.  He believes he injured something while trying to cut firewood.  It hurts when he bends over.  Is been there for a couple weeks.  Movement definitely makes it worse.  He denies any nausea or vomiting.  He denies any melena or hematochezia.  He denies any constipation.  He denies any fevers or chills.  Past Medical History:  Diagnosis Date   Barrett esophagus    BCC (basal cell carcinoma)    Cerebral cyst    Chronic otitis media of right ear    GERD (gastroesophageal reflux disease)    H/O hematuria    Hearing loss on right    Hiatal hernia    High cholesterol    Hx MRSA infection    nose   Hypertension    Prediabetes    Syncope    Past Surgical History:  Procedure Laterality Date   HERNIA REPAIR Right 01/2008   MOLE SURGERY  2014   basil cell   NOSE SURGERY     x 3-4   Current Outpatient Medications on File Prior to Visit  Medication Sig Dispense Refill   amLODipine (NORVASC) 5 MG tablet TAKE 1 TABLET BY MOUTH  EVERY DAY 90 tablet 1   atorvastatin (LIPITOR) 80 MG tablet TAKE 1 TABLET BY MOUTH EVERY DAY AT 6PM 90 tablet 3   diclofenac (VOLTAREN) 75 MG EC tablet Take 1 tablet (75 mg total) by mouth 2 (two) times daily. 30 tablet 0   ezetimibe (ZETIA) 10 MG tablet TAKE 1 TABLET BY MOUTH EVERY DAY 90 tablet 2   fluticasone (FLONASE) 50 MCG/ACT nasal spray SMARTSIG:2 Spray(s) Both Nares Every Night 48 mL 3   Garlic 3086 MG CAPS Take by mouth.     icosapent Ethyl (VASCEPA) 1 g capsule Take 2 capsules (2 g total) by mouth 2 (two) times daily. 360 capsule 3   metoprolol succinate (TOPROL-XL) 50 MG 24 hr tablet TAKE 1 TABLET (50 MG TOTAL) BY MOUTH DAILY. TAKE WITH OR IMMEDIATELY FOLLOWING A MEAL. 270 tablet 1   omeprazole (PRILOSEC) 20 MG capsule TAKE ONE CAPSULE BY MOUTH EVERY DAY (Patient taking differently: 2 qam and 1 qpm) 30 capsule 11   vitamin E 100 UNIT capsule Take by mouth  daily.     No current facility-administered medications on file prior to visit.   Allergies  Allergen Reactions   Amoxicillin Anaphylaxis    REACTION: Rash/shortness of breath   Codeine     REACTION: iritability   Oxycodone-Acetaminophen      Can not take 5-500mg  causes chest tightness and pain    Penicillins    Social History   Socioeconomic History   Marital status: Married    Spouse name: Not on file   Number of children: 2   Years of education: 12   Highest education level: Not on file  Occupational History    Employer: [employer]  Tobacco Use   Smoking status: Never   Smokeless tobacco: Never  Vaping Use   Vaping Use: Never used  Substance and Sexual Activity   Alcohol use: No   Drug use: No   Sexual activity: Not on file  Other Topics Concern   Not on file  Social History Narrative   Patient lives at home with his wife . 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 He denies smoking, drinking.   Social Determinants of Health   Financial Resource Strain: Not on file  Food Insecurity: Not on file  Transportation Needs: Not on file  Physical Activity: Not on file  Stress: Not on file  Social Connections: Not on file  Intimate Partner Violence: Not on file      Review of Systems     Objective:   Physical Exam Vitals reviewed.  Cardiovascular:     Rate and Rhythm: Normal rate and regular rhythm.     Heart sounds: Normal heart sounds.  Pulmonary:     Effort: Pulmonary effort is normal. No respiratory distress.     Breath sounds: Normal breath sounds. No stridor. No wheezing.  Abdominal:     General: Bowel sounds are normal.     Palpations: Abdomen is soft.     Tenderness: There is abdominal tenderness in the right lower quadrant. There is no right CVA tenderness, left CVA tenderness, guarding or rebound. Negative signs include Murphy's sign and McBurney's sign.     Hernia: No hernia  is present. There is no hernia in the left inguinal area or right inguinal area.            Assessment & Plan:  Right lower quadrant abdominal pain  Abdominal pain, RLQ (right lower quadrant)  I really believe the patient has likely developed adhesions from his previous right inguinal hernia repair or could be straining an abdominal wall muscle.  I believe this is most likely musculoskeletal in nature.  Recommended supportive care.  In the past the pain has improved gradually over a couple of weeks.  If the pain worsens or if he develops fevers or chills or melena or hematochezia or nausea or vomiting he is to call me immediately.  Otherwise I anticipate gradual improvement just with anti-inflammatories and time

## 2020-07-30 ENCOUNTER — Other Ambulatory Visit: Payer: Self-pay | Admitting: Cardiology

## 2020-07-30 DIAGNOSIS — E782 Mixed hyperlipidemia: Secondary | ICD-10-CM

## 2020-09-10 ENCOUNTER — Other Ambulatory Visit: Payer: Self-pay | Admitting: Cardiology

## 2020-09-10 DIAGNOSIS — I10 Essential (primary) hypertension: Secondary | ICD-10-CM

## 2020-09-24 ENCOUNTER — Other Ambulatory Visit: Payer: Self-pay | Admitting: Cardiology

## 2020-09-24 DIAGNOSIS — I10 Essential (primary) hypertension: Secondary | ICD-10-CM

## 2020-09-24 DIAGNOSIS — R0789 Other chest pain: Secondary | ICD-10-CM

## 2020-12-11 LAB — HM DIABETES EYE EXAM

## 2020-12-14 ENCOUNTER — Other Ambulatory Visit (HOSPITAL_COMMUNITY): Payer: Self-pay | Admitting: Cardiology

## 2020-12-15 ENCOUNTER — Encounter: Payer: Self-pay | Admitting: *Deleted

## 2020-12-15 LAB — LIPID PANEL
Chol/HDL Ratio: 3.8 ratio (ref 0.0–5.0)
Cholesterol, Total: 152 mg/dL (ref 100–199)
HDL: 40 mg/dL (ref >39)
LDL Chol Calc (NIH): 88 mg/dL (ref 0–99)
Triglycerides: 138 mg/dL (ref 0–149)
VLDL Cholesterol Cal: 24 mg/dL (ref 5–40)

## 2020-12-17 ENCOUNTER — Ambulatory Visit: Payer: BC Managed Care – PPO | Admitting: Cardiology

## 2020-12-18 ENCOUNTER — Other Ambulatory Visit: Payer: Self-pay

## 2020-12-18 ENCOUNTER — Encounter: Payer: Self-pay | Admitting: Cardiology

## 2020-12-18 ENCOUNTER — Ambulatory Visit: Payer: BC Managed Care – PPO | Admitting: Cardiology

## 2020-12-18 VITALS — BP 123/75 | Temp 97.9°F | Ht 67.0 in | Wt 167.8 lb

## 2020-12-18 DIAGNOSIS — E782 Mixed hyperlipidemia: Secondary | ICD-10-CM

## 2020-12-18 DIAGNOSIS — I2510 Atherosclerotic heart disease of native coronary artery without angina pectoris: Secondary | ICD-10-CM

## 2020-12-18 DIAGNOSIS — I10 Essential (primary) hypertension: Secondary | ICD-10-CM

## 2020-12-18 NOTE — Progress Notes (Signed)
Follow up visit  Subjective:   Nathaniel Porter, male    DOB: 1960/06/13, 60 y.o.   MRN: [id number]   HPI  60 y.o. Caucasian male with hypertension, hypertriglyceridemia, nonobstructive CAD  Patient is doing well, denies chest pain, shortness of breath, palpitations, leg edema, orthopnea, PND, TIA/syncope.  Current Outpatient Medications on File Prior to Visit  Medication Sig Dispense Refill   amLODipine (NORVASC) 5 MG tablet TAKE 1 TABLET BY MOUTH EVERY DAY 90 tablet 1   atorvastatin (LIPITOR) 80 MG tablet TAKE 1 TABLET BY MOUTH EVERY DAY AT 6PM 90 tablet 3   cholecalciferol (VITAMIN D3) 25 MCG (1000 UNIT) tablet Take 1,000 Units by mouth daily.     ezetimibe (ZETIA) 10 MG tablet TAKE 1 TABLET BY MOUTH EVERY DAY 90 tablet 2   fluticasone (FLONASE) 50 MCG/ACT nasal spray SMARTSIG:2 Spray(s) Both Nares Every Night 48 mL 3   Garlic 6568 MG CAPS Take by mouth.     icosapent Ethyl (VASCEPA) 1 g capsule Take 2 capsules (2 g total) by mouth 2 (two) times daily. 360 capsule 3   metoprolol succinate (TOPROL-XL) 50 MG 24 hr tablet TAKE 1 TABLET (50 MG TOTAL) BY MOUTH DAILY. TAKE WITH OR IMMEDIATELY FOLLOWING A MEAL. 90 tablet 5   Multiple Vitamin (MULTIVITAMIN WITH MINERALS) TABS tablet Take 1 tablet by mouth daily.     neomycin-polymyxin-hydrocortisone (CORTISPORIN) OTIC solution Place 3 drops into both ears 4 (four) times daily. 10 mL 0   omeprazole (PRILOSEC) 20 MG capsule TAKE ONE CAPSULE BY MOUTH EVERY DAY (Patient taking differently: 2 qam and 1 qpm) 30 capsule 11   vitamin E 100 UNIT capsule Take by mouth daily.     No current facility-administered medications on file prior to visit.    Cardiovascular & other pertient studies:  EKG 12/18/2020: Sinus rhythm 66 bpm Normal EKG  CTA coronary 01/03/2019: 1. Coronary calcium score of 120. This was 15 percentile for age and sex matched control. 2. Normal coronary origin with right dominance. 3. Mild non obstructive calcified plaque  in the proximal and mid LCx. 4.  Minimal non obstructive plaque in the proximal RCA 5. CAD-RADS 2. Mild non-obstructive CAD (25-49%). Consider non-atherosclerotic causes of chest pain. Consider preventive therapy and risk factor modification.    EKG 11/30/2018: Sinus rhythm 74 bpm. Normal EKG.   Recent labs: 12/14/2020: Chol 152, TG 138, HDL 40, LDL 88  12/06/2019: Chol 150, TG 143, HDL 33, LDL 92  08/02/2019: Chol 194, TG 310, HDL 32, LDL 109  04/02/2019: Glucose 119, BUN/Cr 17/0.98. EGFR 83. Na/K 145/4.0.  HbA1C 6.0% Chol 183, TG 294, HDL 34, LDL 110  12/25/2018: Chol 155, TG 152, HDL 35, LDL 93  11/30/2018: Glucose 117, BUN/Cr 18/1.13. EGFR 85. Na/K 139/3.8. Rest of the CMP normal H/H 15/46. MCV 88. Platelets 260    Review of Systems  Cardiovascular:  Negative for chest pain, dyspnea on exertion, leg swelling, palpitations and syncope.       Vitals:   12/18/20 1216  BP: 123/75  Temp: 97.9 F (36.6 C)  SpO2: 96%      Objective:   Physical Exam Vitals and nursing note reviewed.  Constitutional:      Appearance: He is well-developed.  Neck:     Vascular: No JVD.  Cardiovascular:     Rate and Rhythm: Normal rate and regular rhythm.     Pulses: Intact distal pulses.     Heart sounds: Normal heart sounds. No murmur heard. Pulmonary:  Effort: Pulmonary effort is normal.     Breath sounds: Normal breath sounds. No wheezing or rales.          Assessment & Recommendations:   60 y.o. Caucasian male with hypertension, hypertriglyceridemia, nonobstructive CAD   Atypical chest pain: Nonobstructive CAD on CCTA (2020).  Hyperlipidemia: Improved. LDL 88, TG 138 Continue Lipitor 80 mg daily, Zetia 10 mg daily, Vascepa 2 g bid.  Hypertension: Well controlled  Continue follow-up with PCP Dr. Luana Shu.  I will see him on as-needed basis.   [name], MD [facility] Pager: [phone] Office: [phone]

## 2020-12-18 NOTE — Progress Notes (Signed)
Error

## 2021-02-04 ENCOUNTER — Other Ambulatory Visit: Payer: Self-pay

## 2021-02-04 ENCOUNTER — Ambulatory Visit: Payer: BC Managed Care – PPO | Admitting: Family Medicine

## 2021-02-04 ENCOUNTER — Encounter: Payer: Self-pay | Admitting: Family Medicine

## 2021-02-04 VITALS — BP 132/78 | HR 86 | Temp 97.4°F | Resp 18 | Ht 67.0 in | Wt 167.0 lb

## 2021-02-04 DIAGNOSIS — Z125 Encounter for screening for malignant neoplasm of prostate: Secondary | ICD-10-CM

## 2021-02-04 DIAGNOSIS — I2510 Atherosclerotic heart disease of native coronary artery without angina pectoris: Secondary | ICD-10-CM

## 2021-02-04 DIAGNOSIS — Z23 Encounter for immunization: Secondary | ICD-10-CM

## 2021-02-04 DIAGNOSIS — Z1159 Encounter for screening for other viral diseases: Secondary | ICD-10-CM

## 2021-02-04 DIAGNOSIS — N529 Male erectile dysfunction, unspecified: Secondary | ICD-10-CM

## 2021-02-04 MED ORDER — SILDENAFIL CITRATE 100 MG PO TABS: 50.0000 mg | ORAL_TABLET | Freq: Every day | ORAL | 11 refills | Status: DC | PRN

## 2021-02-04 NOTE — Progress Notes (Signed)
Subjective:    Patient ID: Nathaniel Porter, male    DOB: June 28, 1960, 61 y.o.   MRN: [id number]  HPI Had CT of the coronary arteries in 2020 revealing non-obstructive CAD: IMPRESSION: 1. Coronary calcium score of 120. This was 79 percentile for age and sex matched control.   2. Normal coronary origin with right dominance.   3. Mild non obstructive calcified plaque in the proximal and mid LCx.   4.  Minimal non obstructive plaque in the proximal RCA   5. CAD-RADS 2. Mild non-obstructive CAD (25-49%).   LDL was 88 in 11/22.   He is here today with several preventative concerns.  First he would like to discuss what vaccinations he is due for.  He is due for the flu shot.  He is due for a tetanus booster.  He is also due for shingles shot.  I explained to the patient that he is not due for Pneumovax 23 until age 34.  His most recent colonoscopy per his report was 3 years ago and therefore this is up-to-date.  He is due for prostate cancer screening however as he is not have PSA in over a year.  He also questions whether he should be screened for hepatitis C.  I explained to him that the CDC recommends screening all adults born between [place] that he would qualify however he is very low risk did not participate in activities that would increase his risk.  He also reports erectile problems.  I explained to the patient the role of an atherosclerosis likely plays in erectile dysfunction.  He has yet to try Viagra.  Past Medical History:  Diagnosis Date   Barrett esophagus    BCC (basal cell carcinoma)    Cerebral cyst    Chronic otitis media of right ear    GERD (gastroesophageal reflux disease)    H/O hematuria    Hearing loss on right    Hiatal hernia    High cholesterol    Hx MRSA infection    nose   Hypertension    Prediabetes    Syncope    Past Surgical History:  Procedure Laterality Date   HERNIA REPAIR Right 01/2008   MOLE SURGERY  2014   basil cell   NOSE SURGERY      x 3-4   Current Outpatient Medications on File Prior to Visit  Medication Sig Dispense Refill   amLODipine (NORVASC) 5 MG tablet TAKE 1 TABLET BY MOUTH EVERY DAY 90 tablet 1   atorvastatin (LIPITOR) 80 MG tablet TAKE 1 TABLET BY MOUTH EVERY DAY AT 6PM 90 tablet 3   cholecalciferol (VITAMIN D3) 25 MCG (1000 UNIT) tablet Take 1,000 Units by mouth daily.     ezetimibe (ZETIA) 10 MG tablet TAKE 1 TABLET BY MOUTH EVERY DAY 90 tablet 2   fluticasone (FLONASE) 50 MCG/ACT nasal spray SMARTSIG:2 Spray(s) Both Nares Every Night 48 mL 3   Garlic 9678 MG CAPS Take by mouth.     icosapent Ethyl (VASCEPA) 1 g capsule Take 2 capsules (2 g total) by mouth 2 (two) times daily. 360 capsule 3   metoprolol succinate (TOPROL-XL) 50 MG 24 hr tablet TAKE 1 TABLET (50 MG TOTAL) BY MOUTH DAILY. TAKE WITH OR IMMEDIATELY FOLLOWING A MEAL. 90 tablet 5   Multiple Vitamin (MULTIVITAMIN WITH MINERALS) TABS tablet Take 1 tablet by mouth daily.     neomycin-polymyxin-hydrocortisone (CORTISPORIN) OTIC solution Place 3 drops into both ears 4 (four) times daily. 10  mL 0   omeprazole (PRILOSEC) 20 MG capsule TAKE ONE CAPSULE BY MOUTH EVERY DAY (Patient taking differently: 2 qam and 1 qpm) 30 capsule 11   vitamin E 100 UNIT capsule Take by mouth daily.     No current facility-administered medications on file prior to visit.   Allergies  Allergen Reactions   Amoxicillin Anaphylaxis    REACTION: Rash/shortness of breath   Codeine     REACTION: iritability   Oxycodone-Acetaminophen      Can not take 5-500mg  causes chest tightness and pain    Penicillins    Social History   Socioeconomic History   Marital status: Married    Spouse name: Not on file   Number of children: 2   Years of education: 12   Highest education level: Not on file  Occupational History    Employer: [employer]  Tobacco Use   Smoking status: Never   Smokeless tobacco: Never  Vaping Use   Vaping Use: Never used  Substance and Sexual  Activity   Alcohol use: No   Drug use: No   Sexual activity: Not on file  Other Topics Concern   Not on file  Social History Narrative   Patient lives at home with his wife . 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 He denies smoking, drinking.   Social Determinants of Health   Financial Resource Strain: Not on file  Food Insecurity: Not on file  Transportation Needs: Not on file  Physical Activity: Not on file  Stress: Not on file  Social Connections: Not on file  Intimate Partner Violence: Not on file      Review of Systems  All other systems reviewed and are negative.     Objective:   Physical Exam Vitals reviewed.  Constitutional:      General: He is not in acute distress.    Appearance: Normal appearance. He is well-developed and normal weight. He is not ill-appearing or toxic-appearing.  Cardiovascular:     Rate and Rhythm: Normal rate and regular rhythm.     Heart sounds: Normal heart sounds. No murmur heard.   No friction rub. No gallop.  Pulmonary:     Effort: Pulmonary effort is normal. No respiratory distress.     Breath sounds: Normal breath sounds. No stridor. No wheezing.  Abdominal:     General: Bowel sounds are normal. There is no distension.     Palpations: Abdomen is soft.     Tenderness: There is no abdominal tenderness. There is no guarding.  Neurological:     Mental Status: He is alert.          Assessment & Plan:  Encounter for hepatitis C screening test for low risk patient - Plan: Hepatitis C antibody  Prostate cancer screening - Plan: CBC with Differential/Platelet, BASIC METABOLIC PANEL WITH GFR, PSA  Coronary artery disease involving native coronary artery of native heart without angina pectoris - Plan: CBC with Differential/Platelet, BASIC METABOLIC PANEL WITH GFR  Need for immunization against influenza - Plan: Flu Vaccine QUAD 48mo+IM (Fluarix, Fluzone & Alfiuria  Quad PF)  Need for tetanus, diphtheria, and acellular pertussis (Tdap) vaccine - Plan: Tdap vaccine greater than or equal to 7yo IM  Erectile dysfunction, unspecified erectile dysfunction type Regarding his cholesterol, I would like to see his LDL cholesterol below 70.  This was just checked in September and was slightly high at 88.  He is not yet due to  recheck this.  I will get a CBC and a CMP to monitor his lab work including for any anemia or leukemia diabetes or renal dysfunction.  Screen for prostate cancer with a PSA.  Screen for hepatitis C.  Patient received his Tdap today.  He also received his flu shot.  I recommended the shingles vaccine and discussed that he can get this at his local pharmacy.  He is not yet due for Pneumovax 23.  We will try Viagra 50 to 100 mg p.o. 30 minutes prior to sexual activity

## 2021-02-06 LAB — CBC WITH DIFFERENTIAL/PLATELET
Absolute Monocytes: 531 cells/uL (ref 200–950)
Basophils Absolute: 30 cells/uL (ref 0–200)
Basophils Relative: 0.5 %
Eosinophils Absolute: 118 cells/uL (ref 15–500)
Eosinophils Relative: 2 %
HCT: 47.2 % (ref 38.5–50.0)
Hemoglobin: 15.8 g/dL (ref 13.2–17.1)
Lymphs Abs: 1676 cells/uL (ref 850–3900)
MCH: 30 pg (ref 27.0–33.0)
MCHC: 33.5 g/dL (ref 32.0–36.0)
MCV: 89.6 fL (ref 80.0–100.0)
MPV: 10 fL (ref 7.5–12.5)
Monocytes Relative: 9 %
Neutro Abs: 3546 cells/uL (ref 1500–7800)
Neutrophils Relative %: 60.1 %
Platelets: 260 10*3/uL (ref 140–400)
RBC: 5.27 10*6/uL (ref 4.20–5.80)
RDW: 12.3 % (ref 11.0–15.0)
Total Lymphocyte: 28.4 %
WBC: 5.9 10*3/uL (ref 3.8–10.8)

## 2021-02-06 LAB — BASIC METABOLIC PANEL WITH GFR
BUN: 21 mg/dL (ref 7–25)
CO2: 28 mmol/L (ref 20–32)
Calcium: 9.5 mg/dL (ref 8.6–10.3)
Chloride: 104 mmol/L (ref 98–110)
Creat: 1.12 mg/dL (ref 0.70–1.35)
Glucose, Bld: 125 mg/dL — ABNORMAL HIGH (ref 65–99)
Potassium: 4.4 mmol/L (ref 3.5–5.3)
Sodium: 140 mmol/L (ref 135–146)
eGFR: 74 mL/min/{1.73_m2} (ref >=60)

## 2021-02-06 LAB — TEST AUTHORIZATION

## 2021-02-06 LAB — PSA: PSA: 2.35 ng/mL (ref <=4.00)

## 2021-02-06 LAB — HEMOGLOBIN A1C W/OUT EAG: Hgb A1c MFr Bld: 6.3 % of total Hgb — ABNORMAL HIGH (ref <5.7)

## 2021-02-06 LAB — HEPATITIS C ANTIBODY
Hepatitis C Ab: NONREACTIVE
SIGNAL TO CUT-OFF: 0.17 (ref <1.00)

## 2021-03-04 ENCOUNTER — Other Ambulatory Visit: Payer: Self-pay | Admitting: Cardiology

## 2021-03-04 DIAGNOSIS — I10 Essential (primary) hypertension: Secondary | ICD-10-CM

## 2021-04-29 ENCOUNTER — Other Ambulatory Visit: Payer: Self-pay | Admitting: Cardiology

## 2021-04-29 DIAGNOSIS — E782 Mixed hyperlipidemia: Secondary | ICD-10-CM

## 2021-06-12 ENCOUNTER — Other Ambulatory Visit: Payer: Self-pay | Admitting: Family Medicine

## 2021-06-15 ENCOUNTER — Telehealth: Payer: Self-pay | Admitting: Family Medicine

## 2021-06-15 NOTE — Telephone Encounter (Signed)
Requested Prescriptions  Pending Prescriptions Disp Refills  . fluticasone (FLONASE) 50 MCG/ACT nasal spray [Pharmacy Med Name: FLUTICASONE PROP 50 MCG SPRAY] 48 mL 1    Sig: SPRAY 2 SPRAYS INTO EACH NOSTRIL EVERY EVENING     Ear, Nose, and Throat: Nasal Preparations - Corticosteroids Passed - 06/12/2021 10:33 AM      Passed - Valid encounter within last 12 months    Recent Outpatient Visits          4 months ago Encounter for hepatitis C screening test for low risk patient   [place] [name], MD   11 months ago Right lower quadrant abdominal pain   [place] Pickard, Cammie Mcgee, MD   1 year ago Chronic fatigue   [place] [name], MD   2 years ago Abdominal pain, RLQ (right lower quadrant)   [place] [name], MD   2 years ago Benign essential HTN   [place] Pickard, Cammie Mcgee, MD

## 2021-06-16 NOTE — Telephone Encounter (Signed)
Requested medication (s) are due for refill today: yes  Requested medication (s) are on the active medication list: yes  Last refill:  04/23/20 #360 3 RF  Future visit scheduled: no  Notes to clinic:  med not assigned to a protocol   Requested Prescriptions  Pending Prescriptions Disp Refills   VASCEPA 1 g capsule [Pharmacy Med Name: VASCEPA 1 GM CAPSULE] 360 capsule 3    Sig: TAKE 2 CAPSULES BY MOUTH 2 TIMES DAILY.     Off-Protocol Failed - 06/15/2021  1:53 AM      Failed - Medication not assigned to a protocol, review manually.      Passed - Valid encounter within last 12 months    Recent Outpatient Visits           4 months ago Encounter for hepatitis C screening test for low risk patient   [place] [name], MD   11 months ago Right lower quadrant abdominal pain   [place] Schaumann, Cammie Mcgee, MD   1 year ago Chronic fatigue   [place] [name], MD   2 years ago Abdominal pain, RLQ (right lower quadrant)   [place] [name], MD   2 years ago Benign essential HTN   [place] Pickard, Cammie Mcgee, MD

## 2021-06-16 NOTE — Telephone Encounter (Signed)
PLEASE CALL PT FOR ANNUAL OV FOR FUTURE MEDICATION REFILLS

## 2021-06-21 NOTE — Telephone Encounter (Signed)
Pt scheduled for fasting labs and ov on 6/20.

## 2021-07-06 ENCOUNTER — Other Ambulatory Visit: Payer: Self-pay | Admitting: Family Medicine

## 2021-07-06 ENCOUNTER — Ambulatory Visit: Payer: BC Managed Care – PPO | Admitting: Family Medicine

## 2021-07-06 VITALS — BP 115/80 | HR 75 | Temp 98.0°F | Ht 67.0 in | Wt 171.6 lb

## 2021-07-06 DIAGNOSIS — M7551 Bursitis of right shoulder: Secondary | ICD-10-CM | POA: Diagnosis not present

## 2021-07-06 DIAGNOSIS — R7303 Prediabetes: Secondary | ICD-10-CM

## 2021-07-06 MED ORDER — TADALAFIL 20 MG PO TABS: 10.0000 mg | ORAL_TABLET | ORAL | 11 refills | Status: DC | PRN

## 2021-07-06 MED ORDER — OMEPRAZOLE 20 MG PO CPDR
20.0000 mg | DELAYED_RELEASE_CAPSULE | Freq: Two times a day (BID) | ORAL | 3 refills | Status: DC
Start: 2021-07-06 — End: 2022-08-15

## 2021-07-06 NOTE — Telephone Encounter (Signed)
Alternative requested- med not covered by insurance.  Requested Prescriptions  Pending Prescriptions Disp Refills   sildenafil (VIAGRA) 100 MG tablet [Pharmacy Med Name: SILDENAFIL 100 MG TABLET]  0     Urology: Erectile Dysfunction Agents Failed - 07/06/2021  8:46 AM      Failed - AST in normal range and within 360 days    AST  Date Value Ref Range Status  12/25/2018 18 10 - 35 U/L Final         Failed - ALT in normal range and within 360 days    ALT  Date Value Ref Range Status  12/25/2018 31 9 - 46 U/L Final         Passed - Last BP in normal range    BP Readings from Last 1 Encounters:  07/06/21 115/80         Passed - Valid encounter within last 12 months    Recent Outpatient Visits           5 months ago Encounter for hepatitis C screening test for low risk patient   [place] [name], MD   12 months ago Right lower quadrant abdominal pain   [place] [facility] Schaumann, Cammie Mcgee, MD   1 year ago Chronic fatigue   [place] [name], MD   2 years ago Abdominal pain, RLQ (right lower quadrant)   [place] [facility] Schaumann, Cammie Mcgee, MD   2 years ago Benign essential HTN   [place] Pickard, Cammie Mcgee, MD

## 2021-07-06 NOTE — Progress Notes (Signed)
Subjective:    Patient ID: Nathaniel Porter, male    DOB: 1960/07/21, 61 y.o.   MRN: [id number]  Arm Pain    Patient complains of pain in his right shoulder.  He states that it hurts to throw football with his son.  The pain is located in the subacromial space and radiates into the deltoid whenever he makes a throwing motion.  He has no pain with passive abduction of the arm.  But abduction against resistance triggers pain.  He is also here today to repeat his fasting lab work.  Labs in January revealed prediabetes.  We discussed a low carbohydrate diet today.  Past Medical History:  Diagnosis Date   Barrett esophagus    BCC (basal cell carcinoma)    Cerebral cyst    Chronic otitis media of right ear    GERD (gastroesophageal reflux disease)    H/O hematuria    Hearing loss on right    Hiatal hernia    High cholesterol    Hx MRSA infection    nose   Hypertension    Prediabetes    Syncope    Past Surgical History:  Procedure Laterality Date   HERNIA REPAIR Right 01/2008   MOLE SURGERY  2014   basil cell   NOSE SURGERY     x 3-4   Current Outpatient Medications on File Prior to Visit  Medication Sig Dispense Refill   amLODipine (NORVASC) 5 MG tablet TAKE 1 TABLET BY MOUTH EVERY DAY 90 tablet 1   atorvastatin (LIPITOR) 80 MG tablet TAKE 1 TABLET BY MOUTH EVERY DAY AT 6PM 90 tablet 3   ezetimibe (ZETIA) 10 MG tablet TAKE 1 TABLET BY MOUTH EVERY DAY 90 tablet 2   fluticasone (FLONASE) 50 MCG/ACT nasal spray SPRAY 2 SPRAYS INTO EACH NOSTRIL EVERY EVENING 48 mL 1   Multiple Vitamin (MULTIVITAMIN WITH MINERALS) TABS tablet Take 1 tablet by mouth daily.     neomycin-polymyxin-hydrocortisone (CORTISPORIN) OTIC solution Place 3 drops into both ears 4 (four) times daily. 10 mL 0   omeprazole (PRILOSEC) 20 MG capsule TAKE ONE CAPSULE BY MOUTH EVERY DAY (Patient taking differently: 2 qam and 1 qpm) 30 capsule 11   sildenafil (VIAGRA) 100 MG tablet Take 0.5-1 tablets (50-100 mg total) by  mouth daily as needed for erectile dysfunction. 5 tablet 11   VASCEPA 1 g capsule TAKE 2 CAPSULES BY MOUTH 2 TIMES DAILY. 120 capsule 0   cholecalciferol (VITAMIN D3) 25 MCG (1000 UNIT) tablet Take 1,000 Units by mouth daily. (Patient not taking: Reported on 07/06/2021)     metoprolol succinate (TOPROL-XL) 50 MG 24 hr tablet TAKE 1 TABLET (50 MG TOTAL) BY MOUTH DAILY. TAKE WITH OR IMMEDIATELY FOLLOWING A MEAL. 90 tablet 5   vitamin E 100 UNIT capsule Take by mouth daily. (Patient not taking: Reported on 07/06/2021)     No current facility-administered medications on file prior to visit.   Allergies  Allergen Reactions   Amoxicillin Anaphylaxis    REACTION: Rash/shortness of breath   Codeine     REACTION: iritability   Oxycodone-Acetaminophen      Can not take 5-'500mg'$  causes chest tightness and pain    Penicillins    Social History   Socioeconomic History   Marital status: Married    Spouse name: Not on file   Number of children: 2   Years of education: 12   Highest education level: Not on file  Occupational History    Employer: [employer]  Tobacco Use   Smoking status: Never   Smokeless tobacco: Never  Vaping Use   Vaping Use: Never used  Substance and Sexual Activity   Alcohol use: No   Drug use: No   Sexual activity: Not on file  Other Topics Concern   Not on file  Social History Narrative   Patient lives at home with his wife . 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 He denies smoking, drinking.   Social Determinants of Health   Financial Resource Strain: Not on file  Food Insecurity: Not on file  Transportation Needs: Not on file  Physical Activity: Not on file  Stress: Not on file  Social Connections: Not on file  Intimate Partner Violence: Not on file      Review of Systems  All other systems reviewed and are negative.      Objective:   Physical Exam Vitals reviewed.   Constitutional:      General: He is not in acute distress.    Appearance: Normal appearance. He is well-developed and normal weight. He is not ill-appearing or toxic-appearing.  Cardiovascular:     Rate and Rhythm: Normal rate and regular rhythm.     Heart sounds: Normal heart sounds. No murmur heard.    No friction rub. No gallop.  Pulmonary:     Effort: Pulmonary effort is normal. No respiratory distress.     Breath sounds: Normal breath sounds. No stridor. No wheezing.  Musculoskeletal:     Right shoulder: Tenderness present. No bony tenderness. Decreased range of motion. Normal strength.  Neurological:     Mental Status: He is alert.           Assessment & Plan:  Prediabetes - Plan: CBC with Differential/Platelet, Lipid panel, COMPLETE METABOLIC PANEL WITH GFR, Hemoglobin A1c  Subacromial bursitis of right shoulder joint All the patient is here today and would repeat fasting lab work to monitor prediabetes including CMP, and A1c, fasting lipid panel.  I believe the pain in his shoulder is likely subacromial bursitis or even tendinitis of the rotator cuff.  Using sterile technique, I injected the right shoulder with 2 cc lidocaine, 2 cc of Marcaine, and 2 cc of 40 mg/mL Kenalog.  The patient tolerated the procedure well without, patient.  Given his history of coronary artery disease I will keep his LDL cholesterol below 70.  His blood pressure today is excellent at 115/80.

## 2021-07-07 LAB — CBC WITH DIFFERENTIAL/PLATELET
Absolute Monocytes: 640 cells/uL (ref 200–950)
Basophils Absolute: 53 cells/uL (ref 0–200)
Basophils Relative: 0.8 %
Eosinophils Absolute: 172 cells/uL (ref 15–500)
Eosinophils Relative: 2.6 %
HCT: 46.5 % (ref 38.5–50.0)
Hemoglobin: 15.9 g/dL (ref 13.2–17.1)
Lymphs Abs: 2026 cells/uL (ref 850–3900)
MCH: 29.9 pg (ref 27.0–33.0)
MCHC: 34.2 g/dL (ref 32.0–36.0)
MCV: 87.4 fL (ref 80.0–100.0)
MPV: 10.3 fL (ref 7.5–12.5)
Monocytes Relative: 9.7 %
Neutro Abs: 3709 cells/uL (ref 1500–7800)
Neutrophils Relative %: 56.2 %
Platelets: 264 10*3/uL (ref 140–400)
RBC: 5.32 10*6/uL (ref 4.20–5.80)
RDW: 12.3 % (ref 11.0–15.0)
Total Lymphocyte: 30.7 %
WBC: 6.6 10*3/uL (ref 3.8–10.8)

## 2021-07-07 LAB — LIPID PANEL
Cholesterol: 159 mg/dL (ref <200)
HDL: 41 mg/dL (ref >=40)
LDL Cholesterol (Calc): 88 mg/dL (calc)
Non-HDL Cholesterol (Calc): 118 mg/dL (calc) (ref <130)
Total CHOL/HDL Ratio: 3.9 (calc) (ref <5.0)
Triglycerides: 208 mg/dL — ABNORMAL HIGH (ref <150)

## 2021-07-07 LAB — COMPLETE METABOLIC PANEL WITH GFR
AG Ratio: 1.8 (calc) (ref 1.0–2.5)
ALT: 45 U/L (ref 9–46)
AST: 25 U/L (ref 10–35)
Albumin: 4.4 g/dL (ref 3.6–5.1)
Alkaline phosphatase (APISO): 87 U/L (ref 35–144)
BUN: 17 mg/dL (ref 7–25)
CO2: 26 mmol/L (ref 20–32)
Calcium: 9.4 mg/dL (ref 8.6–10.3)
Chloride: 105 mmol/L (ref 98–110)
Creat: 1.15 mg/dL (ref 0.70–1.35)
Globulin: 2.4 g/dL (calc) (ref 1.9–3.7)
Glucose, Bld: 125 mg/dL — ABNORMAL HIGH (ref 65–99)
Potassium: 4.5 mmol/L (ref 3.5–5.3)
Sodium: 142 mmol/L (ref 135–146)
Total Bilirubin: 1.1 mg/dL (ref 0.2–1.2)
Total Protein: 6.8 g/dL (ref 6.1–8.1)
eGFR: 72 mL/min/{1.73_m2} (ref >=60)

## 2021-07-07 LAB — HEMOGLOBIN A1C
Hgb A1c MFr Bld: 6.1 % of total Hgb — ABNORMAL HIGH (ref <5.7)
Mean Plasma Glucose: 128 mg/dL
eAG (mmol/L): 7.1 mmol/L

## 2021-07-15 ENCOUNTER — Other Ambulatory Visit: Payer: Self-pay | Admitting: Family Medicine

## 2021-07-15 NOTE — Telephone Encounter (Signed)
Requested medication (s) are due for refill today:   Yes  Requested medication (s) are on the active medication list:   Yes  Future visit scheduled:   No   Seen 07/06/2021   Last ordered: 06/16/2021 #120, 0 refills  Returned because there isn't a protocol assigned to this medication   Pt seen on 6/20 so not sure if Dr. Dennard Schaumann wanted to see him again or not.     Requested Prescriptions  Pending Prescriptions Disp Refills   VASCEPA 1 g capsule [Pharmacy Med Name: VASCEPA 1 GM CAPSULE] 120 capsule 0    Sig: TAKE 2 CAPSULES BY MOUTH TWICE A DAY     Off-Protocol Failed - 07/15/2021  2:03 AM      Failed - Medication not assigned to a protocol, review manually.      Passed - Valid encounter within last 12 months    Recent Outpatient Visits           5 months ago Encounter for hepatitis C screening test for low risk patient   [place] [name], MD   1 year ago Right lower quadrant abdominal pain   [facility] Dennard Schaumann, Cammie Mcgee, MD   1 year ago Chronic fatigue   [place] [name], MD   2 years ago Abdominal pain, RLQ (right lower quadrant)   [place] [name], MD   2 years ago Benign essential HTN   [place] Pickard, Cammie Mcgee, MD

## 2021-09-11 ENCOUNTER — Other Ambulatory Visit: Payer: Self-pay | Admitting: Cardiology

## 2021-09-11 DIAGNOSIS — E782 Mixed hyperlipidemia: Secondary | ICD-10-CM

## 2021-09-11 DIAGNOSIS — I10 Essential (primary) hypertension: Secondary | ICD-10-CM

## 2021-09-11 DIAGNOSIS — I2510 Atherosclerotic heart disease of native coronary artery without angina pectoris: Secondary | ICD-10-CM

## 2021-09-28 ENCOUNTER — Other Ambulatory Visit: Payer: Self-pay | Admitting: Family Medicine

## 2021-09-29 NOTE — Telephone Encounter (Signed)
Requested medication (s) are due for refill today: yes  Requested medication (s) are on the active medication list: yea=s  Last refill:  08/04/21  Future visit scheduled: yes  Notes to clinic:  med not assigned to protocol    Requested Prescriptions  Pending Prescriptions Disp Refills   VASCEPA 1 g capsule [Pharmacy Med Name: VASCEPA 1 GM CAPSULE] 120 capsule 3    Sig: TAKE 2 CAPSULES BY MOUTH TWICE A DAY     Off-Protocol Failed - 09/28/2021 11:08 AM      Failed - Medication not assigned to a protocol, review manually.      Passed - Valid encounter within last 12 months    Recent Outpatient Visits           7 months ago Encounter for hepatitis C screening test for low risk patient   [place] [name], MD   1 year ago Right lower quadrant abdominal pain   [facility] Dennard Schaumann, Cammie Mcgee, MD   1 year ago Chronic fatigue   [place] [name], MD   2 years ago Abdominal pain, RLQ (right lower quadrant)   [place] [name], MD   2 years ago Benign essential HTN   [place] Pickard, Cammie Mcgee, MD       Future Appointments             In 2 days Pickard, Cammie Mcgee, MD [place]

## 2021-10-01 ENCOUNTER — Ambulatory Visit: Payer: BC Managed Care – PPO | Admitting: Family Medicine

## 2021-10-01 VITALS — BP 118/62 | HR 81 | Temp 98.4°F

## 2021-10-01 DIAGNOSIS — R7303 Prediabetes: Secondary | ICD-10-CM

## 2021-10-01 DIAGNOSIS — H8110 Benign paroxysmal vertigo, unspecified ear: Secondary | ICD-10-CM

## 2021-10-01 DIAGNOSIS — N401 Enlarged prostate with lower urinary tract symptoms: Secondary | ICD-10-CM

## 2021-10-01 DIAGNOSIS — S86811A Strain of other muscle(s) and tendon(s) at lower leg level, right leg, initial encounter: Secondary | ICD-10-CM | POA: Diagnosis not present

## 2021-10-01 DIAGNOSIS — R350 Frequency of micturition: Secondary | ICD-10-CM

## 2021-10-01 NOTE — Progress Notes (Signed)
Subjective:    Patient ID: Nathaniel Porter, male    DOB: 1960-04-17, 61 y.o.   MRN: [id number]  Patient is a very pleasant 61 year old Caucasian gentleman who presents today with 3 concerns.  First, he had 1 episode of what sounds like nystagmus.  He reports dizziness that hit him suddenly.  He reported a side-to-side rotational feeling.  It lasted a few minutes and then subsided.  There was no syncope.  It has not occurred since.  This happened several weeks ago.  He denies any headaches or neurologic deficits.  I believe he was experiencing nystagmus related to BPPV.  Second issue is he is having increased urinary frequency particularly at night.  He is reporting a weaker stream.  He denies any dysuria or hematuria.  Today on prostate exam, he does have some mild BPH but there is no nodularity or asymmetry.  Third issue is a pulling sensation in his right greater than left calf.  He strained his calf a few months ago and ever since that time, it is felt stiff.  The pain is located primarily over the middle lateral portion of the right gastrocnemius.  He has a negative Thompson sign bilaterally.  He has full range of motion in the right ankle and left ankle without pain.  There is no palpable defect or erythema or swelling or deformity.  I believe that he has tight calf muscles.  He has not been performing any stretching activities. Past Medical History:  Diagnosis Date   Barrett esophagus    BCC (basal cell carcinoma)    Cerebral cyst    Chronic otitis media of right ear    GERD (gastroesophageal reflux disease)    H/O hematuria    Hearing loss on right    Hiatal hernia    High cholesterol    Hx MRSA infection    nose   Hypertension    Prediabetes    Syncope    Past Surgical History:  Procedure Laterality Date   HERNIA REPAIR Right 01/2008   MOLE SURGERY  2014   basil cell   NOSE SURGERY     x 3-4   Current Outpatient Medications on File Prior to Visit  Medication Sig Dispense  Refill   amLODipine (NORVASC) 5 MG tablet TAKE 1 TABLET BY MOUTH EVERY DAY 90 tablet 1   atorvastatin (LIPITOR) 80 MG tablet TAKE 1 TABLET BY MOUTH EVERY DAY AT 6PM 90 tablet 3   cholecalciferol (VITAMIN D3) 25 MCG (1000 UNIT) tablet Take 1,000 Units by mouth daily. (Patient not taking: Reported on 07/06/2021)     ezetimibe (ZETIA) 10 MG tablet TAKE 1 TABLET BY MOUTH EVERY DAY 90 tablet 2   fluticasone (FLONASE) 50 MCG/ACT nasal spray SPRAY 2 SPRAYS INTO EACH NOSTRIL EVERY EVENING 48 mL 1   metoprolol succinate (TOPROL-XL) 50 MG 24 hr tablet TAKE 1 TABLET (50 MG TOTAL) BY MOUTH DAILY. TAKE WITH OR IMMEDIATELY FOLLOWING A MEAL. 90 tablet 5   Multiple Vitamin (MULTIVITAMIN WITH MINERALS) TABS tablet Take 1 tablet by mouth daily.     neomycin-polymyxin-hydrocortisone (CORTISPORIN) OTIC solution Place 3 drops into both ears 4 (four) times daily. 10 mL 0   omeprazole (PRILOSEC) 20 MG capsule Take 1 capsule (20 mg total) by mouth 2 (two) times daily before a meal. 180 capsule 3   sildenafil (VIAGRA) 100 MG tablet Take 0.5-1 tablets (50-100 mg total) by mouth daily as needed for erectile dysfunction. 5 tablet 11   sildenafil (VIAGRA)  100 MG tablet Take 1 tablet (100 mg total) by mouth as needed for erectile dysfunction. 10 tablet 11   VASCEPA 1 g capsule TAKE 2 CAPSULES BY MOUTH TWICE A DAY 120 capsule 3   vitamin E 100 UNIT capsule Take by mouth daily. (Patient not taking: Reported on 07/06/2021)     No current facility-administered medications on file prior to visit.   Allergies  Allergen Reactions   Amoxicillin Anaphylaxis    REACTION: Rash/shortness of breath   Codeine     REACTION: iritability   Oxycodone-Acetaminophen      Can not take 5-'500mg'$  causes chest tightness and pain    Penicillins    Social History   Socioeconomic History   Marital status: Married    Spouse name: Not on file   Number of children: 2   Years of education: 12   Highest education level: Not on file  Occupational  History    Employer: [employer]  Tobacco Use   Smoking status: Never   Smokeless tobacco: Never  Vaping Use   Vaping Use: Never used  Substance and Sexual Activity   Alcohol use: No   Drug use: No   Sexual activity: Not on file  Other Topics Concern   Not on file  Social History Narrative   Patient lives at home with his wife . 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 He denies smoking, drinking.   Social Determinants of Health   Financial Resource Strain: Not on file  Food Insecurity: Not on file  Transportation Needs: Not on file  Physical Activity: Not on file  Stress: Not on file  Social Connections: Not on file  Intimate Partner Violence: Not on file      Review of Systems  All other systems reviewed and are negative.      Objective:   Physical Exam Vitals reviewed.  Constitutional:      General: He is not in acute distress.    Appearance: Normal appearance. He is well-developed and normal weight. He is not ill-appearing or toxic-appearing.  Cardiovascular:     Rate and Rhythm: Normal rate and regular rhythm.     Heart sounds: Normal heart sounds. No murmur heard.    No friction rub. No gallop.  Pulmonary:     Effort: Pulmonary effort is normal. No respiratory distress.     Breath sounds: Normal breath sounds. No stridor. No wheezing.  Genitourinary:    Prostate: Enlarged. Not tender and no nodules present.  Musculoskeletal:     Right lower leg: No swelling, deformity, tenderness or bony tenderness. No edema.     Left lower leg: No swelling, deformity, tenderness or bony tenderness. No edema.       Legs:  Neurological:     Mental Status: He is alert.           Assessment & Plan:  Prediabetes - Plan: Hemoglobin B3A, BASIC METABOLIC PANEL WITH GFR  Benign paroxysmal positional vertigo, unspecified laterality  Strain of calf muscle, right, initial encounter  Benign prostatic  hyperplasia with urinary frequency First I see no evidence of a DVT.  The symptoms do not sound neuropathic in nature.  He denies any numbness or tingling or burning.  They also do not sound like claudication as he denies any cramping pain with ambulation that goes away with rest.  There is no palpable deformity in the leg.  I believe this is most likely stiff calf muscles  and I recommended a regimen of stretching activities to try to improve this.  Second I believe that the patient likely had an isolated episode of BPPV.  I would simply monitor this for any recurrence.  I do not believe that the symptoms sound like a cardiac arrhythmia or near syncope or stroke etc.  Third I believe the patient has BPH.  We discussed starting Flomax but he declines at the present time.  Fourth, the patient would like to monitor his blood sugar as he was concerned that the leg pain could be a sign of diabetes.

## 2021-10-02 LAB — BASIC METABOLIC PANEL WITH GFR
BUN: 19 mg/dL (ref 7–25)
CO2: 28 mmol/L (ref 20–32)
Calcium: 9.5 mg/dL (ref 8.6–10.3)
Chloride: 105 mmol/L (ref 98–110)
Creat: 1.01 mg/dL (ref 0.70–1.35)
Glucose, Bld: 118 mg/dL — ABNORMAL HIGH (ref 65–99)
Potassium: 4.6 mmol/L (ref 3.5–5.3)
Sodium: 141 mmol/L (ref 135–146)
eGFR: 84 mL/min/{1.73_m2} (ref >=60)

## 2021-10-02 LAB — HEMOGLOBIN A1C
Hgb A1c MFr Bld: 6.1 % of total Hgb — ABNORMAL HIGH (ref <5.7)
Mean Plasma Glucose: 128 mg/dL
eAG (mmol/L): 7.1 mmol/L

## 2021-11-08 ENCOUNTER — Other Ambulatory Visit (INDEPENDENT_AMBULATORY_CARE_PROVIDER_SITE_OTHER): Payer: BC Managed Care – PPO

## 2021-11-08 DIAGNOSIS — I2510 Atherosclerotic heart disease of native coronary artery without angina pectoris: Secondary | ICD-10-CM

## 2021-11-08 DIAGNOSIS — I10 Essential (primary) hypertension: Secondary | ICD-10-CM

## 2021-11-08 DIAGNOSIS — N401 Enlarged prostate with lower urinary tract symptoms: Secondary | ICD-10-CM

## 2021-11-08 DIAGNOSIS — E7800 Pure hypercholesterolemia, unspecified: Secondary | ICD-10-CM

## 2021-11-08 DIAGNOSIS — Z23 Encounter for immunization: Secondary | ICD-10-CM

## 2021-11-08 DIAGNOSIS — R7303 Prediabetes: Secondary | ICD-10-CM

## 2021-11-08 DIAGNOSIS — Z125 Encounter for screening for malignant neoplasm of prostate: Secondary | ICD-10-CM

## 2021-11-08 DIAGNOSIS — E291 Testicular hypofunction: Secondary | ICD-10-CM

## 2021-11-08 NOTE — Addendum Note (Signed)
Addended by: Randal Buba K on: 11/08/2021 08:31 AM   Modules accepted: Orders

## 2021-11-09 LAB — CBC WITH DIFFERENTIAL/PLATELET
Absolute Monocytes: 757 cells/uL (ref 200–950)
Basophils Absolute: 47 cells/uL (ref 0–200)
Basophils Relative: 0.6 %
Eosinophils Absolute: 187 cells/uL (ref 15–500)
Eosinophils Relative: 2.4 %
HCT: 44.2 % (ref 38.5–50.0)
Hemoglobin: 15 g/dL (ref 13.2–17.1)
Lymphs Abs: 2129 cells/uL (ref 850–3900)
MCH: 30.5 pg (ref 27.0–33.0)
MCHC: 33.9 g/dL (ref 32.0–36.0)
MCV: 89.8 fL (ref 80.0–100.0)
MPV: 10.4 fL (ref 7.5–12.5)
Monocytes Relative: 9.7 %
Neutro Abs: 4680 cells/uL (ref 1500–7800)
Neutrophils Relative %: 60 %
Platelets: 258 10*3/uL (ref 140–400)
RBC: 4.92 10*6/uL (ref 4.20–5.80)
RDW: 12.1 % (ref 11.0–15.0)
Total Lymphocyte: 27.3 %
WBC: 7.8 10*3/uL (ref 3.8–10.8)

## 2021-11-09 LAB — COMPLETE METABOLIC PANEL WITH GFR
AG Ratio: 2 (calc) (ref 1.0–2.5)
ALT: 35 U/L (ref 9–46)
AST: 19 U/L (ref 10–35)
Albumin: 4.3 g/dL (ref 3.6–5.1)
Alkaline phosphatase (APISO): 80 U/L (ref 35–144)
BUN: 14 mg/dL (ref 7–25)
CO2: 27 mmol/L (ref 20–32)
Calcium: 9.4 mg/dL (ref 8.6–10.3)
Chloride: 106 mmol/L (ref 98–110)
Creat: 1.03 mg/dL (ref 0.70–1.35)
Globulin: 2.2 g/dL (calc) (ref 1.9–3.7)
Glucose, Bld: 108 mg/dL — ABNORMAL HIGH (ref 65–99)
Potassium: 4.6 mmol/L (ref 3.5–5.3)
Sodium: 143 mmol/L (ref 135–146)
Total Bilirubin: 1.3 mg/dL — ABNORMAL HIGH (ref 0.2–1.2)
Total Protein: 6.5 g/dL (ref 6.1–8.1)
eGFR: 82 mL/min/{1.73_m2} (ref >=60)

## 2021-11-09 LAB — LIPID PANEL
Cholesterol: 132 mg/dL (ref <200)
HDL: 31 mg/dL — ABNORMAL LOW (ref >=40)
LDL Cholesterol (Calc): 74 mg/dL (calc)
Non-HDL Cholesterol (Calc): 101 mg/dL (calc) (ref <130)
Total CHOL/HDL Ratio: 4.3 (calc) (ref <5.0)
Triglycerides: 176 mg/dL — ABNORMAL HIGH (ref <150)

## 2021-11-09 LAB — HEMOGLOBIN A1C
Hgb A1c MFr Bld: 6.1 % of total Hgb — ABNORMAL HIGH (ref <5.7)
Mean Plasma Glucose: 128 mg/dL
eAG (mmol/L): 7.1 mmol/L

## 2021-11-09 LAB — PSA: PSA: 2.11 ng/mL (ref <=4.00)

## 2021-11-12 ENCOUNTER — Ambulatory Visit (INDEPENDENT_AMBULATORY_CARE_PROVIDER_SITE_OTHER): Payer: BC Managed Care – PPO | Admitting: Family Medicine

## 2021-11-12 ENCOUNTER — Encounter: Payer: Self-pay | Admitting: Family Medicine

## 2021-11-12 VITALS — BP 126/72 | HR 78 | Ht 67.0 in | Wt 166.4 lb

## 2021-11-12 DIAGNOSIS — Z0000 Encounter for general adult medical examination without abnormal findings: Secondary | ICD-10-CM | POA: Diagnosis not present

## 2021-11-12 DIAGNOSIS — I10 Essential (primary) hypertension: Secondary | ICD-10-CM

## 2021-11-12 DIAGNOSIS — E782 Mixed hyperlipidemia: Secondary | ICD-10-CM

## 2021-11-12 DIAGNOSIS — R7303 Prediabetes: Secondary | ICD-10-CM

## 2021-11-12 DIAGNOSIS — I2510 Atherosclerotic heart disease of native coronary artery without angina pectoris: Secondary | ICD-10-CM | POA: Diagnosis not present

## 2021-11-12 DIAGNOSIS — N401 Enlarged prostate with lower urinary tract symptoms: Secondary | ICD-10-CM | POA: Diagnosis not present

## 2021-11-12 DIAGNOSIS — R350 Frequency of micturition: Secondary | ICD-10-CM

## 2021-11-12 NOTE — Progress Notes (Signed)
Subjective:    Patient ID: Nathaniel Porter, male    DOB: 1960-11-06, 61 y.o.   MRN: [id number]  Patient is here today for complete physical exam.  His PSA is stable at 2.11.  His last colonoscopy was in 2019.  I did perform a rectal exam today.  There is a 4 to 5 mm cystlike mass at approximately 4 or 5:00 with the patient standing leaning over the exam table.  This is on his right side.  It is freely mobile and nontender.  I believe this is a cyst.  It is only verge of the rectum.  It does not proceed internally.  Otherwise the patient is doing well.  He has had his flu shot.  He is due for COVID booster.  Shingles vaccine is up-to-date Lab on 11/08/2021  Component Date Value Ref Range Status   WBC 11/08/2021 7.8  3.8 - 10.8 Thousand/uL Final   RBC 11/08/2021 4.92  4.20 - 5.80 Million/uL Final   Hemoglobin 11/08/2021 15.0  13.2 - 17.1 g/dL Final   HCT 11/08/2021 44.2  38.5 - 50.0 % Final   MCV 11/08/2021 89.8  80.0 - 100.0 fL Final   MCH 11/08/2021 30.5  27.0 - 33.0 pg Final   MCHC 11/08/2021 33.9  32.0 - 36.0 g/dL Final   RDW 11/08/2021 12.1  11.0 - 15.0 % Final   Platelets 11/08/2021 258  140 - 400 Thousand/uL Final   MPV 11/08/2021 10.4  7.5 - 12.5 fL Final   Neutro Abs 11/08/2021 4,680  1,500 - 7,800 cells/uL Final   Lymphs Abs 11/08/2021 2,129  850 - 3,900 cells/uL Final   Absolute Monocytes 11/08/2021 757  200 - 950 cells/uL Final   Eosinophils Absolute 11/08/2021 187  15 - 500 cells/uL Final   Basophils Absolute 11/08/2021 47  0 - 200 cells/uL Final   Neutrophils Relative % 11/08/2021 60  % Final   Total Lymphocyte 11/08/2021 27.3  % Final   Monocytes Relative 11/08/2021 9.7  % Final   Eosinophils Relative 11/08/2021 2.4  % Final   Basophils Relative 11/08/2021 0.6  % Final   Glucose, Bld 11/08/2021 108 (H)  65 - 99 mg/dL Final   Comment: .            Fasting reference interval . For someone without known diabetes, a glucose value between 100 and 125 mg/dL is consistent  with prediabetes and should be confirmed with a follow-up test. .    BUN 11/08/2021 14  7 - 25 mg/dL Final   Creat 11/08/2021 1.03  0.70 - 1.35 mg/dL Final   eGFR 11/08/2021 82  > OR = 60 mL/min/1.72m Final   BUN/Creatinine Ratio 11/08/2021 SEE NOTE:  6 - 22 (calc) Final   Comment:    Not Reported: BUN and Creatinine are within    reference range. .    Sodium 11/08/2021 143  135 - 146 mmol/L Final   Potassium 11/08/2021 4.6  3.5 - 5.3 mmol/L Final   Chloride 11/08/2021 106  98 - 110 mmol/L Final   CO2 11/08/2021 27  20 - 32 mmol/L Final   Calcium 11/08/2021 9.4  8.6 - 10.3 mg/dL Final   Total Protein 11/08/2021 6.5  6.1 - 8.1 g/dL Final   Albumin 11/08/2021 4.3  3.6 - 5.1 g/dL Final   Globulin 11/08/2021 2.2  1.9 - 3.7 g/dL (calc) Final   AG Ratio 11/08/2021 2.0  1.0 - 2.5 (calc) Final   Total Bilirubin 11/08/2021 1.3 (H)  0.2 - 1.2 mg/dL Final   Alkaline phosphatase (APISO) 11/08/2021 80  35 - 144 U/L Final   AST 11/08/2021 19  10 - 35 U/L Final   ALT 11/08/2021 35  9 - 46 U/L Final   Hgb A1c MFr Bld 11/08/2021 6.1 (H)  <5.7 % of total Hgb Final   Comment: For someone without known diabetes, a hemoglobin  A1c value between 5.7% and 6.4% is consistent with prediabetes and should be confirmed with a  follow-up test. . For someone with known diabetes, a value <7% indicates that their diabetes is well controlled. A1c targets should be individualized based on duration of diabetes, age, comorbid conditions, and other considerations. . This assay result is consistent with an increased risk of diabetes. . Currently, no consensus exists regarding use of hemoglobin A1c for diagnosis of diabetes for children. .    Mean Plasma Glucose 11/08/2021 128  mg/dL Final   eAG (mmol/L) 11/08/2021 7.1  mmol/L Final   Cholesterol 11/08/2021 132  <200 mg/dL Final   HDL 11/08/2021 31 (L)  > OR = 40 mg/dL Final   Triglycerides 11/08/2021 176 (H)  <150 mg/dL Final   LDL Cholesterol (Calc)  11/08/2021 74  mg/dL (calc) Final   Comment: Reference range: <100 . Desirable range <100 mg/dL for primary prevention;   <70 mg/dL for patients with CHD or diabetic patients  with > or = 2 CHD risk factors. Marland Kitchen LDL-C is now calculated using the Martin-Hopkins  calculation, which is a validated novel method providing  better accuracy than the Friedewald equation in the  estimation of LDL-C.  Cresenciano Genre et al. Annamaria Helling. 9794;801(65): 2061-2068  (http://education.[facility].com/faq/FAQ164)    Total CHOL/HDL Ratio 11/08/2021 4.3  <5.0 (calc) Final   Non-HDL Cholesterol (Calc) 11/08/2021 101  <130 mg/dL (calc) Final   Comment: For patients with diabetes plus 1 major ASCVD risk  factor, treating to a non-HDL-C goal of <100 mg/dL  (LDL-C of <70 mg/dL) is considered a therapeutic  option.    PSA 11/08/2021 2.11  < OR = 4.00 ng/mL Final   Comment: The total PSA value from this assay system is  standardized against the WHO standard. The test  result will be approximately 20% lower when compared  to the equimolar-standardized total PSA (Beckman  Coulter). Comparison of serial PSA results should be  interpreted with this fact in mind. . This test was performed using the Siemens  chemiluminescent method. Values obtained from  different assay methods cannot be used interchangeably. PSA levels, regardless of value, should not be interpreted as absolute evidence of the presence or absence of disease.     Past Medical History:  Diagnosis Date   Barrett esophagus    BCC (basal cell carcinoma)    Cerebral cyst    Chronic otitis media of right ear    GERD (gastroesophageal reflux disease)    H/O hematuria    Hearing loss on right    Hiatal hernia    High cholesterol    Hx MRSA infection    nose   Hypertension    Prediabetes    Syncope    Past Surgical History:  Procedure Laterality Date   HERNIA REPAIR Right 01/2008   MOLE SURGERY  2014   basil cell   NOSE SURGERY     x 3-4    Current Outpatient Medications on File Prior to Visit  Medication Sig Dispense Refill   amLODipine (NORVASC) 5 MG tablet TAKE 1 TABLET BY MOUTH EVERY DAY 90  tablet 1   atorvastatin (LIPITOR) 80 MG tablet TAKE 1 TABLET BY MOUTH EVERY DAY AT 6PM 90 tablet 3   ezetimibe (ZETIA) 10 MG tablet TAKE 1 TABLET BY MOUTH EVERY DAY 90 tablet 2   fluticasone (FLONASE) 50 MCG/ACT nasal spray SPRAY 2 SPRAYS INTO EACH NOSTRIL EVERY EVENING 48 mL 1   metoprolol succinate (TOPROL-XL) 50 MG 24 hr tablet TAKE 1 TABLET (50 MG TOTAL) BY MOUTH DAILY. TAKE WITH OR IMMEDIATELY FOLLOWING A MEAL. 90 tablet 5   Multiple Vitamin (MULTIVITAMIN WITH MINERALS) TABS tablet Take 1 tablet by mouth daily.     neomycin-polymyxin-hydrocortisone (CORTISPORIN) OTIC solution Place 3 drops into both ears 4 (four) times daily. 10 mL 0   omeprazole (PRILOSEC) 20 MG capsule Take 1 capsule (20 mg total) by mouth 2 (two) times daily before a meal. 180 capsule 3   sildenafil (VIAGRA) 100 MG tablet Take 0.5-1 tablets (50-100 mg total) by mouth daily as needed for erectile dysfunction. 5 tablet 11   VASCEPA 1 g capsule TAKE 2 CAPSULES BY MOUTH TWICE A DAY 120 capsule 3   No current facility-administered medications on file prior to visit.   Allergies  Allergen Reactions   Amoxicillin Anaphylaxis    REACTION: Rash/shortness of breath   Codeine     REACTION: iritability   Oxycodone-Acetaminophen      Can not take 5-556m causes chest tightness and pain    Penicillins    Social History   Socioeconomic History   Marital status: Married    Spouse name: Not on file   Number of children: 2   Years of education: 12   Highest education level: Not on file  Occupational History    Employer: [employer] Tobacco Use   Smoking status: Never   Smokeless tobacco: Never  Vaping Use   Vaping Use: Never used  Substance and Sexual Activity   Alcohol use: No   Drug use: No   Sexual activity: Not on file  Other Topics Concern   Not  on file  Social History Narrative   Patient lives at home with his wife . Patient works for [employer], manufacturing systems Patient has two children. He denies smoking, drinking.   Social Determinants of Health   Financial Resource Strain: Not on file  Food Insecurity: Not on file  Transportation Needs: Not on file  Physical Activity: Not on file  Stress: Not on file  Social Connections: Not on file  Intimate Partner Violence: Not on file      Review of Systems  All other systems reviewed and are negative.      Objective:   Physical Exam Vitals reviewed.  Constitutional:      General: He is not in acute distress.    Appearance: Normal appearance. He is well-developed and normal weight. He is not ill-appearing or toxic-appearing.  HENT:     Head: Normocephalic and atraumatic.     Right Ear: Tympanic membrane and ear canal normal.     Left Ear: Tympanic membrane and ear canal normal.     Nose: Nose normal. No congestion or rhinorrhea.     Mouth/Throat:     Mouth: Mucous membranes are moist.     Pharynx: Oropharynx is clear. No oropharyngeal exudate or posterior oropharyngeal erythema.  Eyes:     General:        Right eye: No discharge.        Left eye: No discharge.  Extraocular Movements: Extraocular movements intact.     Conjunctiva/sclera: Conjunctivae normal.     Pupils: Pupils are equal, round, and reactive to light.  Neck:     Vascular: No carotid bruit.  Cardiovascular:     Rate and Rhythm: Normal rate and regular rhythm.     Heart sounds: Normal heart sounds. No murmur heard.    No friction rub. No gallop.  Pulmonary:     Effort: Pulmonary effort is normal. No respiratory distress.     Breath sounds: Normal breath sounds. No stridor. No wheezing, rhonchi or rales.  Abdominal:     General: Abdomen is flat. Bowel sounds are normal. There is no distension.     Palpations: Abdomen is soft. There is no mass.     Tenderness:  There is no abdominal tenderness. There is no guarding or rebound.     Hernia: No hernia is present.  Genitourinary:    Prostate: Enlarged. Not tender and no nodules present.     Rectum: Mass present.    Musculoskeletal:     Cervical back: Normal range of motion and neck supple. No rigidity or tenderness.     Right lower leg: No swelling, deformity, tenderness or bony tenderness. No edema.     Left lower leg: No swelling, deformity, tenderness or bony tenderness. No edema.  Lymphadenopathy:     Cervical: No cervical adenopathy.  Skin:    Coloration: Skin is not jaundiced or pale.     Findings: No bruising, erythema, lesion or rash.  Neurological:     General: No focal deficit present.     Mental Status: He is alert and oriented to person, place, and time. Mental status is at baseline.     Cranial Nerves: No cranial nerve deficit.     Sensory: No sensory deficit.     Motor: No weakness.     Coordination: Coordination normal.     Gait: Gait normal.     Deep Tendon Reflexes: Reflexes normal.  Psychiatric:        Mood and Affect: Mood normal.        Behavior: Behavior normal.        Thought Content: Thought content normal.           Assessment & Plan:  General medical exam  Prediabetes  Benign prostatic hyperplasia with urinary frequency  Coronary artery disease involving native coronary artery of native heart without angina pectoris  Mixed hyperlipidemia  Essential hypertension Very happy with his blood pressure.  He is only taking Lipitor every other day.  Given his history of coronary artery disease I would like him to take Lipitor daily if possible flu shot and shingles vaccine are up-to-date.  I recommended a COVID booster.  Colonoscopy is up-to-date.  Recommend a low carbohydrate diet and regular aerobic exercise to address his prediabetes.  Monitor the mass as diagrammed.  I believe it is likely a subcutaneous cyst.  If there is any growth or change I would  recommend a referral to surgery for biopsy

## 2021-12-23 ENCOUNTER — Other Ambulatory Visit: Payer: Self-pay | Admitting: Cardiology

## 2021-12-23 DIAGNOSIS — R0789 Other chest pain: Secondary | ICD-10-CM

## 2021-12-23 DIAGNOSIS — I10 Essential (primary) hypertension: Secondary | ICD-10-CM

## 2021-12-29 LAB — HM DIABETES EYE EXAM

## 2022-01-05 ENCOUNTER — Other Ambulatory Visit: Payer: Self-pay

## 2022-01-05 ENCOUNTER — Other Ambulatory Visit: Payer: Self-pay | Admitting: Cardiology

## 2022-01-05 DIAGNOSIS — E782 Mixed hyperlipidemia: Secondary | ICD-10-CM

## 2022-01-05 DIAGNOSIS — I10 Essential (primary) hypertension: Secondary | ICD-10-CM

## 2022-01-05 MED ORDER — EZETIMIBE 10 MG PO TABS: 10.0000 mg | ORAL_TABLET | Freq: Every day | ORAL | 3 refills | Status: DC

## 2022-01-07 ENCOUNTER — Other Ambulatory Visit: Payer: Self-pay

## 2022-01-07 DIAGNOSIS — I2510 Atherosclerotic heart disease of native coronary artery without angina pectoris: Secondary | ICD-10-CM

## 2022-01-07 DIAGNOSIS — E782 Mixed hyperlipidemia: Secondary | ICD-10-CM

## 2022-01-07 DIAGNOSIS — I10 Essential (primary) hypertension: Secondary | ICD-10-CM

## 2022-01-07 DIAGNOSIS — R0789 Other chest pain: Secondary | ICD-10-CM

## 2022-01-07 MED ORDER — ATORVASTATIN CALCIUM 80 MG PO TABS: ORAL_TABLET | ORAL | 3 refills | Status: DC

## 2022-01-07 MED ORDER — AMLODIPINE BESYLATE 5 MG PO TABS: 5.0000 mg | ORAL_TABLET | Freq: Every day | ORAL | 1 refills | Status: DC

## 2022-01-07 MED ORDER — METOPROLOL SUCCINATE ER 50 MG PO TB24: 50.0000 mg | ORAL_TABLET | Freq: Every day | ORAL | 2 refills | Status: DC

## 2022-01-18 ENCOUNTER — Ambulatory Visit: Payer: BC Managed Care – PPO | Admitting: Family Medicine

## 2022-01-19 ENCOUNTER — Ambulatory Visit: Payer: BC Managed Care – PPO | Admitting: Family Medicine

## 2022-01-19 ENCOUNTER — Encounter: Payer: Self-pay | Admitting: Family Medicine

## 2022-01-19 VITALS — BP 120/74 | HR 76 | Temp 97.9°F | Ht 67.0 in | Wt 167.0 lb

## 2022-01-19 DIAGNOSIS — J069 Acute upper respiratory infection, unspecified: Secondary | ICD-10-CM | POA: Insufficient documentation

## 2022-01-19 NOTE — Assessment & Plan Note (Signed)

## 2022-01-19 NOTE — Patient Instructions (Signed)

## 2022-01-19 NOTE — Progress Notes (Signed)
Acute Office Visit  Subjective:     Patient ID: Nathaniel Porter, male    DOB: December 06, 1960, 62 y.o.   MRN: [id number]  Chief Complaint  Patient presents with   Acute Visit    throat red and sore; body numbness, lethargy, nasal congestion, hoarseness(takine mucinex) since night of 12/28. No COVID test.    HPI Patient is in today for 1 week of sore throat, stuffy nose, body numbness, lethargy, nasal congestion Denies fever, body aches, shortness of breath, wheezing, loss of taste or smell, N/V/D Symptoms overall improving and he reports feeling much better today Has tried Mucinex No known sick exposures  Review of Systems  All other systems reviewed and are negative.   Past Medical History:  Diagnosis Date   Barrett esophagus    BCC (basal cell carcinoma)    Cerebral cyst    Chronic otitis media of right ear    GERD (gastroesophageal reflux disease)    H/O hematuria    Hearing loss on right    Hiatal hernia    High cholesterol    Hx MRSA infection    nose   Hypertension    Prediabetes    Syncope    Past Surgical History:  Procedure Laterality Date   HERNIA REPAIR Right 01/2008   MOLE SURGERY  2014   basil cell   NOSE SURGERY     x 3-4   Current Outpatient Medications on File Prior to Visit  Medication Sig Dispense Refill   amLODipine (NORVASC) 5 MG tablet Take 1 tablet (5 mg total) by mouth daily. 90 tablet 1   atorvastatin (LIPITOR) 80 MG tablet TAKE 1 TABLET BY MOUTH EVERY DAY AT 6PM 90 tablet 3   ezetimibe (ZETIA) 10 MG tablet Take 1 tablet (10 mg total) by mouth daily. 90 tablet 3   fluticasone (FLONASE) 50 MCG/ACT nasal spray SPRAY 2 SPRAYS INTO EACH NOSTRIL EVERY EVENING 48 mL 1   metoprolol succinate (TOPROL-XL) 50 MG 24 hr tablet Take 1 tablet (50 mg total) by mouth daily. Take with or immediately following a meal. 30 tablet 2   Multiple Vitamin (MULTIVITAMIN WITH MINERALS) TABS tablet Take 1 tablet by mouth daily.     neomycin-polymyxin-hydrocortisone  (CORTISPORIN) OTIC solution Place 3 drops into both ears 4 (four) times daily. 10 mL 0   omeprazole (PRILOSEC) 20 MG capsule Take 1 capsule (20 mg total) by mouth 2 (two) times daily before a meal. 180 capsule 3   sildenafil (VIAGRA) 100 MG tablet Take 0.5-1 tablets (50-100 mg total) by mouth daily as needed for erectile dysfunction. 5 tablet 11   VASCEPA 1 g capsule TAKE 2 CAPSULES BY MOUTH TWICE A DAY 120 capsule 3   No current facility-administered medications on file prior to visit.   Allergies  Allergen Reactions   Amoxicillin Anaphylaxis    REACTION: Rash/shortness of breath   Codeine     REACTION: iritability   Oxycodone-Acetaminophen      Can not take 5-'500mg'$  causes chest tightness and pain    Penicillins        Objective:    BP 120/74   Pulse 76   Temp 97.9 F (36.6 C) (Oral)   Ht '5\' 7"'$  (1.702 m)   Wt 167 lb (75.8 kg)   SpO2 98%   BMI 26.16 kg/m    Physical Exam Vitals and nursing note reviewed.  Constitutional:      Appearance: Normal appearance. He is normal weight.  HENT:  Head: Normocephalic and atraumatic.     Right Ear: Tympanic membrane, ear canal and external ear normal.     Left Ear: Tympanic membrane, ear canal and external ear normal.     Nose: Nose normal.     Mouth/Throat:     Pharynx: Oropharynx is clear.  Eyes:     Conjunctiva/sclera: Conjunctivae normal.  Cardiovascular:     Rate and Rhythm: Normal rate and regular rhythm.     Pulses: Normal pulses.     Heart sounds: Normal heart sounds.  Pulmonary:     Effort: Pulmonary effort is normal.     Breath sounds: Normal breath sounds.  Musculoskeletal:     Cervical back: No tenderness.  Lymphadenopathy:     Cervical: No cervical adenopathy.  Skin:    General: Skin is warm and dry.     Capillary Refill: Capillary refill takes less than 2 seconds.  Neurological:     General: No focal deficit present.     Mental Status: He is alert and oriented to person, place, and time. Mental status  is at baseline.  Psychiatric:        Mood and Affect: Mood normal.        Behavior: Behavior normal.        Thought Content: Thought content normal.        Judgment: Judgment normal.     No results found for any visits on 01/19/22.      Assessment & Plan:   Problem List Items Addressed This Visit       Respiratory   Viral URI - Primary    Reassured patient that symptoms and exam findings are most consistent with a viral upper respiratory infection and explained lack of efficacy of antibiotics against viruses.  Discussed expected course and features suggestive of secondary bacterial infection.  Continue supportive care. Increase fluid intake with water or electrolyte solution like pedialyte. Encouraged acetaminophen as needed for fever/pain. Encouraged salt water gargling, chloraseptic spray and throat lozenges. Encouraged OTC guaifenesin. Encouraged saline sinus flushes and/or neti with humidified air.         No orders of the defined types were placed in this encounter.   Return if symptoms worsen or fail to improve.  Rubie Maid, FNP

## 2022-02-02 ENCOUNTER — Encounter: Payer: Self-pay | Admitting: Family Medicine

## 2022-02-04 ENCOUNTER — Telehealth: Payer: Self-pay

## 2022-02-04 NOTE — Telephone Encounter (Signed)
PRIOR AUTH FOR VASCEPA:  [place] (Key: [id number])  This request has received an approval. View the bottom of the request for an electronic copy of the approval letter.

## 2022-03-09 ENCOUNTER — Other Ambulatory Visit: Payer: Self-pay | Admitting: Family Medicine

## 2022-03-09 NOTE — Telephone Encounter (Signed)
Requested medication (s) are due for refill today - yes  Requested medication (s) are on the active medication list -yes  Future visit scheduled -no  Last refill: 09/30/21 #120 3RF  Notes to clinic: off protocol medication- provider review   Requested Prescriptions  Pending Prescriptions Disp Refills   VASCEPA 1 g capsule [Pharmacy Med Name: VASCEPA 1 GM CAPSULE] 120 capsule 3    Sig: TAKE 2 CAPSULES BY MOUTH TWICE A DAY     Off-Protocol Failed - 03/09/2022  1:34 AM      Failed - Medication not assigned to a protocol, review manually.      Failed - Valid encounter within last 12 months    Recent Outpatient Visits           1 year ago Encounter for hepatitis C screening test for low risk patient   [place] [name], MD   1 year ago Right lower quadrant abdominal pain   [facility] Pick[facility], Cammie Mcgee, MD   1 year ago Chronic fatigue   [place] [name], MD   3 years ago Abdominal pain, RLQ (right lower quadrant)   [place] [facility] Schaumann, Cammie Mcgee, MD   3 years ago Benign essential HTN   [place] Pickard, Cammie Mcgee, MD                 Requested Prescriptions  Pending Prescriptions Disp Refills   VASCEPA 1 g capsule [Pharmacy Med Name: VASCEPA 1 GM CAPSULE] 120 capsule 3    Sig: TAKE 2 CAPSULES BY MOUTH TWICE A DAY     Off-Protocol Failed - 03/09/2022  1:34 AM      Failed - Medication not assigned to a protocol, review manually.      Failed - Valid encounter within last 12 months    Recent Outpatient Visits           1 year ago Encounter for hepatitis C screening test for low risk patient   [place] [name], MD   1 year ago Right lower quadrant abdominal pain   [facility] Schaumann, Cammie Mcgee, MD   1 year ago Chronic fatigue   [place], Warren T, MD   3 years ago Abdominal pain, RLQ  (right lower quadrant)   [place] [facility] Scha[facility] Cammie Mcgee, MD   3 years ago Benign essential HTN   [place] Pickard, Cammie Mcgee, MD

## 2022-03-23 ENCOUNTER — Other Ambulatory Visit: Payer: Self-pay | Admitting: Family Medicine

## 2022-03-23 DIAGNOSIS — I10 Essential (primary) hypertension: Secondary | ICD-10-CM

## 2022-03-23 DIAGNOSIS — R0789 Other chest pain: Secondary | ICD-10-CM

## 2022-03-23 NOTE — Telephone Encounter (Signed)
OV- 11/12/21  Requested Prescriptions  Pending Prescriptions Disp Refills   metoprolol succinate (TOPROL-XL) 50 MG 24 hr tablet [Pharmacy Med Name: METOPROLOL SUCC ER 50 MG TAB] 90 tablet 0    Sig: TAKE 1 TABLET BY MOUTH DAILY. TAKE WITH OR IMMEDIATELY FOLLOWING A MEAL.     Cardiovascular:  Beta Blockers Failed - 03/23/2022 10:57 AM      Failed - Valid encounter within last 6 months    Recent Outpatient Visits           1 year ago Encounter for hepatitis C screening test for low risk patient   [place] [name], MD   1 year ago Right lower quadrant abdominal pain   [place] [facility] Schaumann, Cammie Mcgee, MD   2 years ago Chronic fatigue   [place] [name], MD   3 years ago Abdominal pain, RLQ (right lower quadrant)   [place] Denn[facility] Schaumann, Cammie Mcgee, MD   3 years ago Benign essential HTN   [place] Pickard, Cammie Mcgee, MD              Passed - Last BP in normal range    BP Readings from Last 1 Encounters:  01/19/22 120/74         Passed - Last Heart Rate in normal range    Pulse Readings from Last 1 Encounters:  01/19/22 76

## 2022-05-03 ENCOUNTER — Encounter: Payer: Self-pay | Admitting: Family Medicine

## 2022-05-03 ENCOUNTER — Ambulatory Visit: Payer: BC Managed Care – PPO | Admitting: Family Medicine

## 2022-05-03 VITALS — BP 120/72 | HR 71 | Temp 98.5°F | Ht 67.0 in | Wt 170.8 lb

## 2022-05-03 DIAGNOSIS — S46001A Unspecified injury of muscle(s) and tendon(s) of the rotator cuff of right shoulder, initial encounter: Secondary | ICD-10-CM | POA: Diagnosis not present

## 2022-05-03 NOTE — Progress Notes (Signed)
Subjective:    Patient ID: Nathaniel Porter, male    DOB: 07/03/1960, 62 y.o.   MRN: [id number]  Arm Pain   6/23, the patient complained of pain in his right shoulder.  He states that it hurts to throw football with his son.  The pain is located in the subacromial space and radiates into the deltoid whenever he makes a throwing motion.  He has no pain with passive abduction of the arm.  But abduction against resistance triggers pain.    05/02/21 Patient states that he received about 2 months of benefit from the last cortisone injection then the shoulder started hurting again.  He now reports pain lifting his arm greater than 90 degrees.  It hurts to raise his arm above his head.  He feels like the pain radiates down into his deltoid muscle.  He has a positive empty can sign as well as weakness to resisted abduction.  Hawking sign does not elicit any pain. Past Medical History:  Diagnosis Date   Barrett esophagus    BCC (basal cell carcinoma)    Cerebral cyst    Chronic otitis media of right ear    GERD (gastroesophageal reflux disease)    H/O hematuria    Hearing loss on right    Hiatal hernia    High cholesterol    Hx MRSA infection    nose   Hypertension    Prediabetes    Syncope    Past Surgical History:  Procedure Laterality Date   HERNIA REPAIR Right 01/2008   MOLE SURGERY  2014   basil cell   NOSE SURGERY     x 3-4   Current Outpatient Medications on File Prior to Visit  Medication Sig Dispense Refill   amLODipine (NORVASC) 5 MG tablet Take 1 tablet (5 mg total) by mouth daily. 90 tablet 1   atorvastatin (LIPITOR) 80 MG tablet TAKE 1 TABLET BY MOUTH EVERY DAY AT 6PM 90 tablet 3   ezetimibe (ZETIA) 10 MG tablet Take 1 tablet (10 mg total) by mouth daily. 90 tablet 3   fluticasone (FLONASE) 50 MCG/ACT nasal spray SPRAY 2 SPRAYS INTO EACH NOSTRIL EVERY EVENING 48 mL 1   metoprolol succinate (TOPROL-XL) 50 MG 24 hr tablet TAKE 1 TABLET BY MOUTH DAILY. TAKE WITH OR  IMMEDIATELY FOLLOWING A MEAL. 90 tablet 0   Multiple Vitamin (MULTIVITAMIN WITH MINERALS) TABS tablet Take 1 tablet by mouth daily.     neomycin-polymyxin-hydrocortisone (CORTISPORIN) OTIC solution Place 3 drops into both ears 4 (four) times daily. 10 mL 0   omeprazole (PRILOSEC) 20 MG capsule Take 1 capsule (20 mg total) by mouth 2 (two) times daily before a meal. 180 capsule 3   sildenafil (VIAGRA) 100 MG tablet Take 0.5-1 tablets (50-100 mg total) by mouth daily as needed for erectile dysfunction. 5 tablet 11   VASCEPA 1 g capsule TAKE 2 CAPSULES BY MOUTH TWICE A DAY 120 capsule 3   No current facility-administered medications on file prior to visit.   Allergies  Allergen Reactions   Amoxicillin Anaphylaxis    REACTION: Rash/shortness of breath   Codeine     REACTION: iritability   Oxycodone-Acetaminophen      Can not take 5-500mg  causes chest tightness and pain    Penicillins    Social History   Socioeconomic History   Marital status: Married    Spouse name: Not on file   Number of children: 2   Years of education: 73  Highest education level: Not on file  Occupational History    Employer: [employer]  Tobacco Use   Smoking status: Never   Smokeless tobacco: Never  Vaping Use   Vaping Use: Never used  Substance and Sexual Activity   Alcohol use: No   Drug use: No   Sexual activity: Not on file  Other Topics Concern   Not on file  Social History Narrative   Patient lives at home with his wife . 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 He denies smoking, drinking.   Social Determinants of Health   Financial Resource Strain: Not on file  Food Insecurity: Not on file  Transportation Needs: Not on file  Physical Activity: Not on file  Stress: Not on file  Social Connections: Not on file  Intimate Partner Violence: Not on file      Review of Systems  All other systems reviewed and are  negative.      Objective:   Physical Exam Vitals reviewed.  Constitutional:      General: He is not in acute distress.    Appearance: Normal appearance. He is well-developed and normal weight. He is not ill-appearing or toxic-appearing.  Cardiovascular:     Rate and Rhythm: Normal rate and regular rhythm.     Heart sounds: Normal heart sounds. No murmur heard.    No friction rub. No gallop.  Pulmonary:     Effort: Pulmonary effort is normal. No respiratory distress.     Breath sounds: Normal breath sounds. No stridor. No wheezing.  Musculoskeletal:     Right shoulder: Tenderness present. No bony tenderness. Decreased range of motion. Decreased strength.  Neurological:     Mental Status: He is alert.           Assessment & Plan:  Injury of muscle or tendon of right rotator cuff Based on his exam, I am concerned that he may have a partial tear in the supraspinatus muscle.  I recommended getting an MRI to assess the extent of the damage.  The patient requested cortisone shot today.  He states if the shot does not help with pain this time, he will return for the MRI.  Using sterile technique, I injected the right subacromial space with 2 cc of lidocaine, 2 cc of Marcaine, and 2 cc of 40 mg/mL Kenalog.  The patient tolerated the procedure well without complication

## 2022-05-17 ENCOUNTER — Other Ambulatory Visit: Payer: BC Managed Care – PPO

## 2022-05-17 DIAGNOSIS — N401 Enlarged prostate with lower urinary tract symptoms: Secondary | ICD-10-CM

## 2022-05-17 DIAGNOSIS — M791 Myalgia, unspecified site: Secondary | ICD-10-CM

## 2022-05-17 DIAGNOSIS — E7800 Pure hypercholesterolemia, unspecified: Secondary | ICD-10-CM

## 2022-05-17 DIAGNOSIS — R7303 Prediabetes: Secondary | ICD-10-CM

## 2022-05-17 DIAGNOSIS — I10 Essential (primary) hypertension: Secondary | ICD-10-CM

## 2022-05-18 LAB — COMPLETE METABOLIC PANEL WITH GFR
AG Ratio: 2 (calc) (ref 1.0–2.5)
ALT: 62 U/L — ABNORMAL HIGH (ref 9–46)
AST: 25 U/L (ref 10–35)
Albumin: 4.2 g/dL (ref 3.6–5.1)
Alkaline phosphatase (APISO): 81 U/L (ref 35–144)
BUN: 22 mg/dL (ref 7–25)
CO2: 28 mmol/L (ref 20–32)
Calcium: 9.2 mg/dL (ref 8.6–10.3)
Chloride: 105 mmol/L (ref 98–110)
Creat: 1.1 mg/dL (ref 0.70–1.35)
Globulin: 2.1 g/dL (calc) (ref 1.9–3.7)
Glucose, Bld: 116 mg/dL — ABNORMAL HIGH (ref 65–99)
Potassium: 5 mmol/L (ref 3.5–5.3)
Sodium: 141 mmol/L (ref 135–146)
Total Bilirubin: 0.9 mg/dL (ref 0.2–1.2)
Total Protein: 6.3 g/dL (ref 6.1–8.1)
eGFR: 75 mL/min/{1.73_m2} (ref >=60)

## 2022-05-18 LAB — CBC WITH DIFFERENTIAL/PLATELET
Absolute Monocytes: 941 cells/uL (ref 200–950)
Basophils Absolute: 29 cells/uL (ref 0–200)
Basophils Relative: 0.3 %
Eosinophils Absolute: 88 cells/uL (ref 15–500)
Eosinophils Relative: 0.9 %
HCT: 47.3 % (ref 38.5–50.0)
Hemoglobin: 15.7 g/dL (ref 13.2–17.1)
Lymphs Abs: 1980 cells/uL (ref 850–3900)
MCH: 29.8 pg (ref 27.0–33.0)
MCHC: 33.2 g/dL (ref 32.0–36.0)
MCV: 89.9 fL (ref 80.0–100.0)
MPV: 9.8 fL (ref 7.5–12.5)
Monocytes Relative: 9.6 %
Neutro Abs: 6762 cells/uL (ref 1500–7800)
Neutrophils Relative %: 69 %
Platelets: 253 10*3/uL (ref 140–400)
RBC: 5.26 10*6/uL (ref 4.20–5.80)
RDW: 12.8 % (ref 11.0–15.0)
Total Lymphocyte: 20.2 %
WBC: 9.8 10*3/uL (ref 3.8–10.8)

## 2022-05-18 LAB — LIPID PANEL
Cholesterol: 145 mg/dL (ref <200)
HDL: 47 mg/dL (ref >=40)
LDL Cholesterol (Calc): 79 mg/dL (calc)
Non-HDL Cholesterol (Calc): 98 mg/dL (calc) (ref <130)
Total CHOL/HDL Ratio: 3.1 (calc) (ref <5.0)
Triglycerides: 106 mg/dL (ref <150)

## 2022-05-18 LAB — PSA: PSA: 2.38 ng/mL (ref <=4.00)

## 2022-05-18 LAB — HEMOGLOBIN A1C
Hgb A1c MFr Bld: 6.7 % of total Hgb — ABNORMAL HIGH (ref <5.7)
Mean Plasma Glucose: 146 mg/dL
eAG (mmol/L): 8.1 mmol/L

## 2022-05-23 ENCOUNTER — Other Ambulatory Visit: Payer: Self-pay

## 2022-05-23 DIAGNOSIS — E119 Type 2 diabetes mellitus without complications: Secondary | ICD-10-CM

## 2022-05-23 MED ORDER — EMPAGLIFLOZIN 25 MG PO TABS: 25.0000 mg | ORAL_TABLET | Freq: Every day | ORAL | 3 refills | Status: DC

## 2022-06-11 ENCOUNTER — Other Ambulatory Visit: Payer: Self-pay | Admitting: Family Medicine

## 2022-06-14 NOTE — Telephone Encounter (Signed)
Requested medication (s) are due for refill today: Due 07/08/22  Requested medication (s) are on the active medication list: yes    Last refill: 03/09/22  #120  3 refills  Future visit scheduled yes  08/23/22  Notes to clinic:Off protocol, please review.  Thank you.  Requested Prescriptions  Pending Prescriptions Disp Refills   VASCEPA 1 g capsule [Pharmacy Med Name: VASCEPA 1 GM CAPSULE] 120 capsule 3    Sig: TAKE 2 CAPSULES BY MOUTH TWICE A DAY     Off-Protocol Failed - 06/11/2022  4:49 AM      Failed - Medication not assigned to a protocol, review manually.      Failed - Valid encounter within last 12 months    Recent Outpatient Visits           1 year ago Encounter for hepatitis C screening test for low risk patient   [facility] Donita Brooks, MD   1 year ago Right lower quadrant abdominal pain   [facility] Tanya Nones, Priscille Heidelberg, MD   2 years ago Chronic fatigue   [facility] Donita Brooks, MD   3 years ago Abdominal pain, RLQ (right lower quadrant)   [facility] Donita Brooks, MD   3 years ago Benign essential HTN   [facility] Pickard, Priscille Heidelberg, MD       Future Appointments             In 2 months Pickard, Priscille Heidelberg, MD [facility], [facility]   In 5 months Pickard, Priscille Heidelberg, MD [facility], [facility]

## 2022-06-22 ENCOUNTER — Other Ambulatory Visit: Payer: Self-pay | Admitting: Family Medicine

## 2022-06-22 DIAGNOSIS — I10 Essential (primary) hypertension: Secondary | ICD-10-CM

## 2022-06-22 DIAGNOSIS — R0789 Other chest pain: Secondary | ICD-10-CM

## 2022-06-22 NOTE — Telephone Encounter (Signed)
OV 05/03/22 Requested Prescriptions  Pending Prescriptions Disp Refills   metoprolol succinate (TOPROL-XL) 50 MG 24 hr tablet [Pharmacy Med Name: METOPROLOL SUCC ER 50 MG TAB] 90 tablet 0    Sig: TAKE 1 TABLET BY MOUTH EVERY DAY WITH OR IMMEDIATELY FOLLOWING A MEAL     Cardiovascular:  Beta Blockers Failed - 06/22/2022  2:35 AM      Failed - Valid encounter within last 6 months    Recent Outpatient Visits           1 year ago Encounter for hepatitis C screening test for low risk patient   [facility] Donita Brooks, MD   1 year ago Right lower quadrant abdominal pain   [facility] Tanya Nones, Priscille Heidelberg, MD   2 years ago Chronic fatigue   [facility] Donita Brooks, MD   3 years ago Abdominal pain, RLQ (right lower quadrant)   [facility] Tanya Nones, Priscille Heidelberg, MD   3 years ago Benign essential HTN   [facility] Pickard, Priscille Heidelberg, MD       Future Appointments             In 2 months Pickard, Priscille Heidelberg, MD [facility], [facility]   In 4 months Tanya Nones, Priscille Heidelberg, MD [facility], [facility]            Passed - Last BP in normal range    BP Readings from Last 1 Encounters:  05/03/22 120/72         Passed - Last Heart Rate in normal range    Pulse Readings from Last 1 Encounters:  05/03/22 71

## 2022-08-09 ENCOUNTER — Other Ambulatory Visit: Payer: Self-pay | Admitting: Family Medicine

## 2022-08-09 DIAGNOSIS — I10 Essential (primary) hypertension: Secondary | ICD-10-CM

## 2022-08-09 NOTE — Telephone Encounter (Signed)
Prescription Request  08/09/2022  LOV: 05/03/2022  What is the name of the medication or equipment?   amLODipine (NORVASC) 5 MG tablet   Have you contacted your pharmacy to request a refill? Yes   Which pharmacy would you like this sent to?  [id number] [place], [place] - [address] [place] [address] Phone: [phone] Fax: [phone]    Patient notified that their request is being sent to the clinical staff for review and that they should receive a response within 2 business days.   Please advise pharmacist.

## 2022-08-10 MED ORDER — AMLODIPINE BESYLATE 5 MG PO TABS
5.0000 mg | ORAL_TABLET | Freq: Every day | ORAL | 1 refills | Status: DC
Start: 2022-08-10 — End: 2023-01-26

## 2022-08-10 NOTE — Telephone Encounter (Signed)
OV 11/12/21, 05/03/22 Requested Prescriptions  Pending Prescriptions Disp Refills   amLODipine (NORVASC) 5 MG tablet 90 tablet 1    Sig: Take 1 tablet (5 mg total) by mouth daily.     Cardiovascular: Calcium Channel Blockers 2 Failed - 08/09/2022 12:12 PM      Failed - Valid encounter within last 6 months    Recent Outpatient Visits           1 year ago Encounter for hepatitis C screening test for low risk patient   [facility] Donita Brooks, MD   2 years ago Right lower quadrant abdominal pain   [facility] Tanya Nones, Priscille Heidelberg, MD   2 years ago Chronic fatigue   [facility] Donita Brooks, MD   3 years ago Abdominal pain, RLQ (right lower quadrant)   [facility] Donita Brooks, MD   3 years ago Benign essential HTN   [facility] Pickard, Priscille Heidelberg, MD       Future Appointments             In 1 week Pickard, Priscille Heidelberg, MD [facility], [facility]   In 3 months Tanya Nones, Priscille Heidelberg, MD [facility], [facility]            Passed - Last BP in normal range    BP Readings from Last 1 Encounters:  05/03/22 120/72         Passed - Last Heart Rate in normal range    Pulse Readings from Last 1 Encounters:  05/03/22 71

## 2022-08-15 ENCOUNTER — Other Ambulatory Visit: Payer: Self-pay | Admitting: Family Medicine

## 2022-08-23 ENCOUNTER — Encounter: Payer: Self-pay | Admitting: Family Medicine

## 2022-08-23 ENCOUNTER — Ambulatory Visit: Payer: BC Managed Care – PPO | Admitting: Family Medicine

## 2022-08-23 VITALS — BP 120/64 | HR 68 | Temp 97.9°F | Ht 67.0 in | Wt 159.6 lb

## 2022-08-23 DIAGNOSIS — I2510 Atherosclerotic heart disease of native coronary artery without angina pectoris: Secondary | ICD-10-CM | POA: Diagnosis not present

## 2022-08-23 DIAGNOSIS — S46001A Unspecified injury of muscle(s) and tendon(s) of the rotator cuff of right shoulder, initial encounter: Secondary | ICD-10-CM | POA: Diagnosis not present

## 2022-08-23 DIAGNOSIS — E119 Type 2 diabetes mellitus without complications: Secondary | ICD-10-CM

## 2022-08-23 DIAGNOSIS — W57XXXA Bitten or stung by nonvenomous insect and other nonvenomous arthropods, initial encounter: Secondary | ICD-10-CM

## 2022-08-23 DIAGNOSIS — S40862A Insect bite (nonvenomous) of left upper arm, initial encounter: Secondary | ICD-10-CM | POA: Diagnosis not present

## 2022-08-23 NOTE — Progress Notes (Signed)
Subjective:    Patient ID: Nathaniel Porter, male    DOB: [birth year], 62 y.o.   MRN: [id number]  Arm Pain    I saw the patient last in April.  At that time I performed a cortisone injection in his right shoulder.  Patient saw limited benefit from that.  The pain is returned.  Also concerned that me he may have damaged his rotator cuff.  At this point given the fact he has failed conservative treatment, tincture of time, and a cortisone injection, I have recommended orthopedic consultation.  About a month ago he was bit on the tricep of his left arm but it.  He took a picture which showed a large erythematous urticarial plaque.  It appears to be about 4 to 5 cm in diameter.  He states that the plaque came back on 2 separate occasions which would be atypical for an allergic reaction.  He denies any other symptoms.  The rash is now faded.  He is due to recheck his diabetes and his cholesterol.  He has known coronary artery disease.  Due to elevated liver function test on his last lab work he reduced his atorvastatin to every other day.  He is taking Zetia every day.  His goal LDL cholesterol is less than 55.  He is on Jardiance for his diabetes.  His last A1c was 6.7.  His blood pressure today is outstanding Past Medical History:  Diagnosis Date   Barrett esophagus    BCC (basal cell carcinoma)    Cerebral cyst    Chronic otitis media of right ear    GERD (gastroesophageal reflux disease)    H/O hematuria    Hearing loss on right    Hiatal hernia    High cholesterol    Hx MRSA infection    nose   Hypertension    Prediabetes    Syncope    Past Surgical History:  Procedure Laterality Date   HERNIA REPAIR Right 01/2008   MOLE SURGERY  2014   basil cell   NOSE SURGERY     x 3-4   Current Outpatient Medications on File Prior to Visit  Medication Sig Dispense Refill   amLODipine (NORVASC) 5 MG tablet Take 1 tablet (5 mg total) by mouth daily. 90 tablet 1   atorvastatin (LIPITOR) 80 MG  tablet TAKE 1 TABLET BY MOUTH EVERY DAY AT 6PM 90 tablet 3   empagliflozin (JARDIANCE) 25 MG TABS tablet Take 1 tablet (25 mg total) by mouth daily before breakfast. 30 tablet 3   ezetimibe (ZETIA) 10 MG tablet Take 1 tablet (10 mg total) by mouth daily. 90 tablet 3   fluticasone (FLONASE) 50 MCG/ACT nasal spray SPRAY 2 SPRAYS INTO EACH NOSTRIL EVERY EVENING 48 mL 1   metoprolol succinate (TOPROL-XL) 50 MG 24 hr tablet TAKE 1 TABLET BY MOUTH EVERY DAY WITH OR IMMEDIATELY FOLLOWING A MEAL 90 tablet 0   Multiple Vitamin (MULTIVITAMIN WITH MINERALS) TABS tablet Take 1 tablet by mouth daily.     neomycin-polymyxin-hydrocortisone (CORTISPORIN) OTIC solution Place 3 drops into both ears 4 (four) times daily. 10 mL 0   omeprazole (PRILOSEC) 20 MG capsule TAKE 1 CAPSULE BY MOUTH 2 TIMES DAILY BEFORE A MEAL. 180 capsule 3   sildenafil (VIAGRA) 100 MG tablet Take 0.5-1 tablets (50-100 mg total) by mouth daily as needed for erectile dysfunction. 5 tablet 11   VASCEPA 1 g capsule TAKE 2 CAPSULES BY MOUTH TWICE A DAY 120 capsule 3  No current facility-administered medications on file prior to visit.   Allergies  Allergen Reactions   Amoxicillin Anaphylaxis    REACTION: Rash/shortness of breath   Codeine     REACTION: iritability   Oxycodone-Acetaminophen      Can not take 5-500mg  causes chest tightness and pain    Penicillins    Social History   Socioeconomic History   Marital status: Married    Spouse name: Not on file   Number of children: 2   Years of education: 12   Highest education level: Not on file  Occupational History    Employer: [employer]  Tobacco Use   Smoking status: Never   Smokeless tobacco: Never  Vaping Use   Vaping status: Never Used  Substance and Sexual Activity   Alcohol use: No   Drug use: No   Sexual activity: Not on file  Other Topics Concern   Not on file  Social History Narrative   Patient lives at home with his wife . 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 He denies smoking, drinking.   Social Determinants of Health   Financial Resource Strain: Not on file  Food Insecurity: Not on file  Transportation Needs: Not on file  Physical Activity: Not on file  Stress: Not on file  Social Connections: Unknown (08/10/2022)   Received from [facility]   Social Network    Social Network: Not on file  Intimate Partner Violence: Unknown (08/10/2022)   Received from [facility]   HITS    Physically Hurt: Not on file    Insult or Talk Down To: Not on file    Threaten Physical Harm: Not on file    Scream or Curse: Not on file      Review of Systems  All other systems reviewed and are negative.      Objective:   Physical Exam Vitals reviewed.  Constitutional:      General: He is not in acute distress.    Appearance: Normal appearance. He is well-developed and normal weight. He is not ill-appearing or toxic-appearing.  Cardiovascular:     Rate and Rhythm: Normal rate and regular rhythm.     Heart sounds: Normal heart sounds. No murmur heard.    No friction rub. No gallop.  Pulmonary:     Effort: Pulmonary effort is normal. No respiratory distress.     Breath sounds: Normal breath sounds. No stridor. No wheezing.  Musculoskeletal:     Right shoulder: Tenderness present. No bony tenderness. Decreased range of motion. Decreased strength.  Neurological:     Mental Status: He is alert.           Assessment & Plan:  Tick bite of left upper arm, initial encounter - Plan: B. burgdorfi antibodies by WB  Controlled type 2 diabetes mellitus without complication, without long-term current use of insulin (HCC) - Plan: Hemoglobin A1c, COMPLETE METABOLIC PANEL WITH GFR, Lipid panel, Protein / Creatinine Ratio, Urine  Injury of muscle or tendon of right rotator cuff - Plan: Ambulatory referral to Orthopedic Surgery  Coronary artery disease involving native  coronary artery of native heart without angina pectoris Based on his exam, I am concerned that he may have a partial tear in the supraspinatus muscle.  Given that he has failed a cortisone injection, I have recommended an orthopedic consultation.  I will check his A1c.  Goal A1c is less than 6.5.  I will also check  a fasting lipid panel.  Goal LDL cholesterol is less than 55 given his known coronary artery disease.  Monitor his liver function test.  Right now he is taking his atorvastatin every other day.  I believe that the rash on his tricep was an allergic reaction.  However I will check Lyme titers and treat if positive

## 2022-08-29 ENCOUNTER — Other Ambulatory Visit: Payer: Self-pay | Admitting: Family Medicine

## 2022-08-29 DIAGNOSIS — E119 Type 2 diabetes mellitus without complications: Secondary | ICD-10-CM

## 2022-08-30 NOTE — Telephone Encounter (Signed)
Requested Prescriptions  Pending Prescriptions Disp Refills   empagliflozin (JARDIANCE) 25 MG TABS tablet [Pharmacy Med Name: JARDIANCE 25 MG TABLET] 90 tablet 1    Sig: TAKE 1 TABLET BY MOUTH DAILY BEFORE BREAKFAST.     Endocrinology:  Diabetes - SGLT2 Inhibitors Failed - 08/29/2022  2:40 AM      Failed - Valid encounter within last 6 months    Recent Outpatient Visits           1 year ago Encounter for hepatitis C screening test for low risk patient   [facility] Donita Brooks, MD   2 years ago Right lower quadrant abdominal pain   [facility] Tanya Nones, Priscille Heidelberg, MD   2 years ago Chronic fatigue   [facility] Donita Brooks, MD   3 years ago Abdominal pain, RLQ (right lower quadrant)   [facility] Donita Brooks, MD   3 years ago Benign essential HTN   [facility] Pickard, Priscille Heidelberg, MD       Future Appointments             In 2 months Pickard, Priscille Heidelberg, MD [facility], [facility]            Passed - Cr in normal range and within 360 days    Creat  Date Value Ref Range Status  08/23/2022 1.14 0.70 - 1.35 mg/dL Final   Creatinine, Urine  Date Value Ref Range Status  08/23/2022 114 20 - 320 mg/dL Final         Passed - HBA1C is between 0 and 7.9 and within 180 days    Hgb A1c MFr Bld  Date Value Ref Range Status  08/23/2022 6.1 (H) <5.7 % of total Hgb Final    Comment:    For someone without known diabetes, a hemoglobin  A1c value between 5.7% and 6.4% is consistent with prediabetes and should be confirmed with a  follow-up test. . For someone with known diabetes, a value <7% indicates that their diabetes is well controlled. A1c targets should be individualized based on duration of diabetes, age, comorbid conditions, and other considerations. . This assay result is consistent with an increased risk of diabetes. . Currently, no consensus exists  regarding use of hemoglobin A1c for diagnosis of diabetes for children. .          Passed - eGFR in normal range and within 360 days    GFR, Est African American  Date Value Ref Range Status  12/25/2018 89 > OR = 60 mL/min/1.47m2 Final   GFR calc Af Amer  Date Value Ref Range Status  04/02/2019 96 >59 mL/min/1.73 Final   GFR, Est Non African American  Date Value Ref Range Status  12/25/2018 77 > OR = 60 mL/min/1.82m2 Final   GFR calc non Af Amer  Date Value Ref Range Status  04/02/2019 83 >59 mL/min/1.73 Final   eGFR  Date Value Ref Range Status  08/23/2022 72 > OR = 60 mL/min/1.45m2 Final

## 2022-09-18 ENCOUNTER — Other Ambulatory Visit: Payer: Self-pay | Admitting: Family Medicine

## 2022-09-18 DIAGNOSIS — R0789 Other chest pain: Secondary | ICD-10-CM

## 2022-09-18 DIAGNOSIS — I10 Essential (primary) hypertension: Secondary | ICD-10-CM

## 2022-09-21 NOTE — Telephone Encounter (Signed)
Requested Prescriptions  Pending Prescriptions Disp Refills   metoprolol succinate (TOPROL-XL) 50 MG 24 hr tablet [Pharmacy Med Name: METOPROLOL SUCC ER 50 MG TAB] 90 tablet 0    Sig: TAKE 1 TABLET BY MOUTH EVERY DAY WITH OR IMMEDIATELY FOLLOWING A MEAL     Cardiovascular:  Beta Blockers Failed - 09/18/2022  8:51 AM      Failed - Valid encounter within last 6 months    Recent Outpatient Visits           1 year ago Encounter for hepatitis C screening test for low risk patient   [facility] Donita Brooks, MD   2 years ago Right lower quadrant abdominal pain   [facility] Tanya Nones, Priscille Heidelberg, MD   2 years ago Chronic fatigue   [facility] Donita Brooks, MD   3 years ago Abdominal pain, RLQ (right lower quadrant)   [facility] Tanya Nones, Priscille Heidelberg, MD   3 years ago Benign essential HTN   [facility] Pickard, Priscille Heidelberg, MD       Future Appointments             In 1 month Pickard, Priscille Heidelberg, MD [facility], [facility]            Passed - Last BP in normal range    BP Readings from Last 1 Encounters:  08/23/22 120/64         Passed - Last Heart Rate in normal range    Pulse Readings from Last 1 Encounters:  08/23/22 68

## 2022-10-11 ENCOUNTER — Ambulatory Visit: Payer: BC Managed Care – PPO | Admitting: Family Medicine

## 2022-10-11 ENCOUNTER — Encounter: Payer: Self-pay | Admitting: Family Medicine

## 2022-10-11 VITALS — BP 118/76 | HR 70 | Temp 98.8°F | Ht 67.0 in | Wt 157.8 lb

## 2022-10-11 DIAGNOSIS — J069 Acute upper respiratory infection, unspecified: Secondary | ICD-10-CM | POA: Diagnosis not present

## 2022-10-11 NOTE — Progress Notes (Signed)
Subjective:    Patient ID: Nathaniel Porter, male    DOB: 05-26-1960, 62 y.o.   MRN: [id number]  Patient reports a 1 week history of sore scratchy throat.  This seems to be worse when he is lying down at night and first thing in running.  His wife has similar symptoms.  He denies any sinus pain.  He denies any sinus headache.  He does have some head congestion and an occasional cough.  Cough is nonproductive.  He denies any chest pain or shortness of breath.  He denies any fevers or chills.  There is no lymphadenopathy in the neck.  There is no posterior oropharyngeal erythema.  There is no exudate. Past Medical History:  Diagnosis Date   Barrett esophagus    BCC (basal cell carcinoma)    Cerebral cyst    Chronic otitis media of right ear    GERD (gastroesophageal reflux disease)    H/O hematuria    Hearing loss on right    Hiatal hernia    High cholesterol    Hx MRSA infection    nose   Hypertension    Prediabetes    Syncope    Past Surgical History:  Procedure Laterality Date   HERNIA REPAIR Right 01/2008   MOLE SURGERY  2014   basil cell   NOSE SURGERY     x 3-4   Current Outpatient Medications on File Prior to Visit  Medication Sig Dispense Refill   amLODipine (NORVASC) 5 MG tablet Take 1 tablet (5 mg total) by mouth daily. 90 tablet 1   atorvastatin (LIPITOR) 80 MG tablet TAKE 1 TABLET BY MOUTH EVERY DAY AT 6PM 90 tablet 3   empagliflozin (JARDIANCE) 25 MG TABS tablet TAKE 1 TABLET BY MOUTH DAILY BEFORE BREAKFAST. 90 tablet 1   ezetimibe (ZETIA) 10 MG tablet Take 1 tablet (10 mg total) by mouth daily. 90 tablet 3   fluticasone (FLONASE) 50 MCG/ACT nasal spray SPRAY 2 SPRAYS INTO EACH NOSTRIL EVERY EVENING 48 mL 1   metoprolol succinate (TOPROL-XL) 50 MG 24 hr tablet TAKE 1 TABLET BY MOUTH EVERY DAY WITH OR IMMEDIATELY FOLLOWING A MEAL 90 tablet 0   Multiple Vitamin (MULTIVITAMIN WITH MINERALS) TABS tablet Take 1 tablet by mouth daily.     neomycin-polymyxin-hydrocortisone  (CORTISPORIN) OTIC solution Place 3 drops into both ears 4 (four) times daily. 10 mL 0   omeprazole (PRILOSEC) 20 MG capsule TAKE 1 CAPSULE BY MOUTH 2 TIMES DAILY BEFORE A MEAL. 180 capsule 3   sildenafil (VIAGRA) 100 MG tablet Take 0.5-1 tablets (50-100 mg total) by mouth daily as needed for erectile dysfunction. 5 tablet 11   VASCEPA 1 g capsule TAKE 2 CAPSULES BY MOUTH TWICE A DAY 120 capsule 3   No current facility-administered medications on file prior to visit.   Allergies  Allergen Reactions   Amoxicillin Anaphylaxis    REACTION: Rash/shortness of breath   Codeine     REACTION: iritability   Oxycodone-Acetaminophen      Can not take 5-500mg  causes chest tightness and pain    Penicillins    Social History   Socioeconomic History   Marital status: Married    Spouse name: Not on file   Number of children: 2   Years of education: 12   Highest education level: Not on file  Occupational History    Employer: [employer]  Tobacco Use   Smoking status: Never   Smokeless tobacco: Never  Vaping Use  Vaping status: Never Used  Substance and Sexual Activity   Alcohol use: No   Drug use: No   Sexual activity: Not on file  Other Topics Concern   Not on file  Social History Narrative   Patient lives at home with his wife . 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 He denies smoking, drinking.   Social Determinants of Health   Financial Resource Strain: Not on file  Food Insecurity: Not on file  Transportation Needs: Not on file  Physical Activity: Not on file  Stress: Not on file  Social Connections: Unknown (08/10/2022)   Received from [facility]   Social Network    Social Network: Not on file  Intimate Partner Violence: Unknown (08/10/2022)   Received from [facility]   HITS    Physically Hurt: Not on file    Insult or Talk Down To: Not on file    Threaten Physical Harm: Not on file    Scream  or Curse: Not on file      Review of Systems  All other systems reviewed and are negative.      Objective:   Physical Exam Vitals reviewed.  Constitutional:      General: He is not in acute distress.    Appearance: Normal appearance. He is well-developed and normal weight. He is not ill-appearing or toxic-appearing.  HENT:     Right Ear: Tympanic membrane and ear canal normal.     Left Ear: Tympanic membrane and ear canal normal.     Nose: Congestion present. No rhinorrhea.     Right Sinus: No maxillary sinus tenderness or frontal sinus tenderness.     Left Sinus: No maxillary sinus tenderness or frontal sinus tenderness.     Mouth/Throat:     Mouth: Mucous membranes are moist.     Pharynx: No oropharyngeal exudate or posterior oropharyngeal erythema.  Cardiovascular:     Rate and Rhythm: Normal rate and regular rhythm.     Heart sounds: Normal heart sounds. No murmur heard.    No friction rub. No gallop.  Pulmonary:     Effort: Pulmonary effort is normal. No respiratory distress.     Breath sounds: Normal breath sounds. No stridor. No wheezing.  Neurological:     Mental Status: He is alert.           Assessment & Plan:  Upper respiratory tract infection, unspecified type I believe the patient is dealing with viral upper respiratory infection.  I believe his symptoms will likely last 3 or 4 more days and then subside.  Recommended symptomatic relief with Coricidin to address congestion.  He could also use ibuprofen for pain or swelling.  His wife has some Magic mouthwash at home for pain and discomfort.  I recommended that the patient try some of that as well as needed for sore throat.  Recheck immediately if worsening or if he develops fever.  No evidence of sinus infection today

## 2022-10-27 ENCOUNTER — Other Ambulatory Visit: Payer: Self-pay | Admitting: Family Medicine

## 2022-10-27 NOTE — Telephone Encounter (Signed)
Prescription Request  10/27/2022  LOV: 10/11/2022  What is the name of the medication or equipment?   VASCEPA 1 g capsule   Have you contacted your pharmacy to request a refill? Yes   Which pharmacy would you like this sent to?  [id number] [place], [place] - [address] [place] [address] Phone: [phone] Fax: [phone]    Patient notified that their request is being sent to the clinical staff for review and that they should receive a response within 2 business days.   Please advise pharmacist.

## 2022-10-27 NOTE — Telephone Encounter (Signed)
Requested medication (s) are due for refill today: yes   Requested medication (s) are on the active medication list: yes   Last refill:  06/14/22 #120 3 refills  Future visit scheduled: yes in 2 weeks   Notes to clinic:  medication not assigned to a protocol. Last OV 10/11/22. Do you want to refill Rx?     Requested Prescriptions  Pending Prescriptions Disp Refills   icosapent Ethyl (VASCEPA) 1 g capsule 120 capsule 3    Sig: Take 2 capsules (2 g total) by mouth 2 (two) times daily.     Off-Protocol Failed - 10/27/2022  8:41 AM      Failed - Medication not assigned to a protocol, review manually.      Failed - Valid encounter within last 12 months    Recent Outpatient Visits           1 year ago Encounter for hepatitis C screening test for low risk patient   [facility] Donita Brooks, MD   2 years ago Right lower quadrant abdominal pain   [facility] Tanya Nones, Priscille Heidelberg, MD   2 years ago Chronic fatigue   [facility] Donita Brooks, MD   3 years ago Abdominal pain, RLQ (right lower quadrant)   [facility] Donita Brooks, MD   3 years ago Benign essential HTN   [facility] Pickard, Priscille Heidelberg, MD       Future Appointments             In 2 weeks Pickard, Priscille Heidelberg, MD [facility], [facility]

## 2022-10-31 LAB — HM COLONOSCOPY

## 2022-11-01 ENCOUNTER — Other Ambulatory Visit: Payer: Self-pay

## 2022-11-01 ENCOUNTER — Telehealth: Payer: Self-pay

## 2022-11-01 DIAGNOSIS — E782 Mixed hyperlipidemia: Secondary | ICD-10-CM

## 2022-11-01 MED ORDER — ICOSAPENT ETHYL 1 G PO CAPS: 2.0000 g | ORAL_CAPSULE | Freq: Two times a day (BID) | ORAL | 2 refills | Status: DC

## 2022-11-01 NOTE — Telephone Encounter (Signed)
Prescription Request  11/01/2022  LOV: 10/11/22  What is the name of the medication or equipment? VASCEPA 1 g capsule [[id number]]  Have you contacted your pharmacy to request a refill? Yes   Which pharmacy would you like this sent to?  [id number] [place], [place] - [address] [place] [address] Phone: [phone] Fax: [phone]    Patient notified that their request is being sent to the clinical staff for review and that they should receive a response within 2 business days.   Please advise at [facility] [phone]

## 2022-11-11 ENCOUNTER — Other Ambulatory Visit: Payer: BC Managed Care – PPO

## 2022-11-11 DIAGNOSIS — Z0000 Encounter for general adult medical examination without abnormal findings: Secondary | ICD-10-CM

## 2022-11-12 LAB — COMPLETE METABOLIC PANEL WITH GFR
AG Ratio: 1.9 (calc) (ref 1.0–2.5)
ALT: 30 U/L (ref 9–46)
AST: 19 U/L (ref 10–35)
Albumin: 4.2 g/dL (ref 3.6–5.1)
Alkaline phosphatase (APISO): 78 U/L (ref 35–144)
BUN: 17 mg/dL (ref 7–25)
CO2: 30 mmol/L (ref 20–32)
Calcium: 9 mg/dL (ref 8.6–10.3)
Chloride: 105 mmol/L (ref 98–110)
Creat: 1.06 mg/dL (ref 0.70–1.35)
Globulin: 2.2 g/dL (ref 1.9–3.7)
Glucose, Bld: 105 mg/dL — ABNORMAL HIGH (ref 65–99)
Potassium: 4.3 mmol/L (ref 3.5–5.3)
Sodium: 142 mmol/L (ref 135–146)
Total Bilirubin: 0.8 mg/dL (ref 0.2–1.2)
Total Protein: 6.4 g/dL (ref 6.1–8.1)
eGFR: 78 mL/min/{1.73_m2} (ref >=60)

## 2022-11-12 LAB — CBC WITH DIFFERENTIAL/PLATELET
Absolute Lymphocytes: 1588 {cells}/uL (ref 850–3900)
Absolute Monocytes: 643 {cells}/uL (ref 200–950)
Basophils Absolute: 40 {cells}/uL (ref 0–200)
Basophils Relative: 0.6 %
Eosinophils Absolute: 107 {cells}/uL (ref 15–500)
Eosinophils Relative: 1.6 %
HCT: 50 % (ref 38.5–50.0)
Hemoglobin: 16.1 g/dL (ref 13.2–17.1)
MCH: 28.8 pg (ref 27.0–33.0)
MCHC: 32.2 g/dL (ref 32.0–36.0)
MCV: 89.4 fL (ref 80.0–100.0)
MPV: 10.1 fL (ref 7.5–12.5)
Monocytes Relative: 9.6 %
Neutro Abs: 4322 {cells}/uL (ref 1500–7800)
Neutrophils Relative %: 64.5 %
Platelets: 238 10*3/uL (ref 140–400)
RBC: 5.59 10*6/uL (ref 4.20–5.80)
RDW: 12.6 % (ref 11.0–15.0)
Total Lymphocyte: 23.7 %
WBC: 6.7 10*3/uL (ref 3.8–10.8)

## 2022-11-12 LAB — LIPID PANEL
Cholesterol: 133 mg/dL (ref <200)
HDL: 35 mg/dL — ABNORMAL LOW (ref >=40)
LDL Cholesterol (Calc): 77 mg/dL
Non-HDL Cholesterol (Calc): 98 mg/dL (ref <130)
Total CHOL/HDL Ratio: 3.8 (calc) (ref <5.0)
Triglycerides: 124 mg/dL (ref <150)

## 2022-11-15 ENCOUNTER — Ambulatory Visit (INDEPENDENT_AMBULATORY_CARE_PROVIDER_SITE_OTHER): Payer: BC Managed Care – PPO | Admitting: Family Medicine

## 2022-11-15 VITALS — BP 124/80 | HR 78 | Temp 98.2°F | Ht 67.0 in | Wt 160.1 lb

## 2022-11-15 DIAGNOSIS — I10 Essential (primary) hypertension: Secondary | ICD-10-CM

## 2022-11-15 DIAGNOSIS — I2510 Atherosclerotic heart disease of native coronary artery without angina pectoris: Secondary | ICD-10-CM

## 2022-11-15 DIAGNOSIS — E119 Type 2 diabetes mellitus without complications: Secondary | ICD-10-CM

## 2022-11-15 DIAGNOSIS — Z0000 Encounter for general adult medical examination without abnormal findings: Secondary | ICD-10-CM

## 2022-11-15 DIAGNOSIS — Z23 Encounter for immunization: Secondary | ICD-10-CM

## 2022-11-15 DIAGNOSIS — Z0001 Encounter for general adult medical examination with abnormal findings: Secondary | ICD-10-CM

## 2022-11-15 NOTE — Progress Notes (Signed)
Subjective:    Patient ID: Nathaniel Porter, male    DOB: 09/22/1960, 62 y.o.   MRN: [id number]  Patient is here today for complete physical exam.  Patient states that he had a colonoscopy last week.  We have not received the results of this but he was told that he had polyps and that he should repeat the colonoscopy in 3 years.  This will be in 2027.  His PSA was checked in April of this year and was stable.  We are due to recheck his PSA in April 2025.  He is due for his flu shot.  Due to being a diabetic he is due for Pneumovax 23.  He defers that today.  Shingles vaccine is up-to-date.  He declines shot.  He denies any falls.  He denies any memory loss.  He is scheduled eye doctor appointment for December.  He denies any vision changes.  He is regularly seeing his dermatologist due to his history of skin cancers.  He recently had 2 basal cell carcinomas removed.  His blood pressure today is outstanding at 124/80 Lab on 11/11/2022  Component Date Value Ref Range Status   Glucose, Bld 11/11/2022 105 (H)  65 - 99 mg/dL Final   Comment: .            Fasting reference interval . For someone without known diabetes, a glucose value between 100 and 125 mg/dL is consistent with prediabetes and should be confirmed with a follow-up test. .    BUN 11/11/2022 17  7 - 25 mg/dL Final   Creat 40/10/2723 1.06  0.70 - 1.35 mg/dL Final   eGFR 36/64/4034 78  > OR = 60 mL/min/1.44m2 Final   BUN/Creatinine Ratio 11/11/2022 SEE NOTE:  6 - 22 (calc) Final   Comment:    Not Reported: BUN and Creatinine are within    reference range. .    Sodium 11/11/2022 142  135 - 146 mmol/L Final   Potassium 11/11/2022 4.3  3.5 - 5.3 mmol/L Final   Chloride 11/11/2022 105  98 - 110 mmol/L Final   CO2 11/11/2022 30  20 - 32 mmol/L Final   Calcium 11/11/2022 9.0  8.6 - 10.3 mg/dL Final   Total Protein 74/25/9563 6.4  6.1 - 8.1 g/dL Final   Albumin 87/56/4332 4.2  3.6 - 5.1 g/dL Final   Globulin 95/18/8416 2.2  1.9 -  3.7 g/dL (calc) Final   AG Ratio 11/11/2022 1.9  1.0 - 2.5 (calc) Final   Total Bilirubin 11/11/2022 0.8  0.2 - 1.2 mg/dL Final   Alkaline phosphatase (APISO) 11/11/2022 78  35 - 144 U/L Final   AST 11/11/2022 19  10 - 35 U/L Final   ALT 11/11/2022 30  9 - 46 U/L Final   Cholesterol 11/11/2022 133  <200 mg/dL Final   HDL 60/63/0160 35 (L)  > OR = 40 mg/dL Final   Triglycerides 10/93/2355 124  <150 mg/dL Final   LDL Cholesterol (Calc) 11/11/2022 77  mg/dL (calc) Final   Comment: Reference range: <100 . Desirable range <100 mg/dL for primary prevention;   <70 mg/dL for patients with CHD or diabetic patients  with > or = 2 CHD risk factors. Marland Kitchen LDL-C is now calculated using the Martin-Hopkins  calculation, which is a validated novel method providing  better accuracy than the Friedewald equation in the  estimation of LDL-C.  Horald Pollen et al. Lenox Ahr. 7322;025(42): 2061-2068  (http://education.[facility].com/faq/FAQ164)    Total CHOL/HDL Ratio 11/11/2022 3.8  <  5.0 (calc) Final   Non-HDL Cholesterol (Calc) 11/11/2022 98  <130 mg/dL (calc) Final   Comment: For patients with diabetes plus 1 major ASCVD risk  factor, treating to a non-HDL-C goal of <100 mg/dL  (LDL-C of <16 mg/dL) is considered a therapeutic  option.    WBC 11/11/2022 6.7  3.8 - 10.8 Thousand/uL Final   RBC 11/11/2022 5.59  4.20 - 5.80 Million/uL Final   Hemoglobin 11/11/2022 16.1  13.2 - 17.1 g/dL Final   HCT 10/96/0454 50.0  38.5 - 50.0 % Final   MCV 11/11/2022 89.4  80.0 - 100.0 fL Final   MCH 11/11/2022 28.8  27.0 - 33.0 pg Final   MCHC 11/11/2022 32.2  32.0 - 36.0 g/dL Final   Comment: For adults, a slight decrease in the calculated MCHC value (in the range of 30 to 32 g/dL) is most likely not clinically significant; however, it should be interpreted with caution in correlation with other red cell parameters and the patient's clinical condition.    RDW 11/11/2022 12.6  11.0 - 15.0 % Final   Platelets  11/11/2022 238  140 - 400 Thousand/uL Final   MPV 11/11/2022 10.1  7.5 - 12.5 fL Final   Neutro Abs 11/11/2022 4,322  1,500 - 7,800 cells/uL Final   Absolute Lymphocytes 11/11/2022 1,588  850 - 3,900 cells/uL Final   Absolute Monocytes 11/11/2022 643  200 - 950 cells/uL Final   Eosinophils Absolute 11/11/2022 107  15 - 500 cells/uL Final   Basophils Absolute 11/11/2022 40  0 - 200 cells/uL Final   Neutrophils Relative % 11/11/2022 64.5  % Final   Total Lymphocyte 11/11/2022 23.7  % Final   Monocytes Relative 11/11/2022 9.6  % Final   Eosinophils Relative 11/11/2022 1.6  % Final   Basophils Relative 11/11/2022 0.6  % Final    Past Medical History:  Diagnosis Date   Barrett esophagus    BCC (basal cell carcinoma)    Cerebral cyst    Chronic otitis media of right ear    GERD (gastroesophageal reflux disease)    H/O hematuria    Hearing loss on right    Hiatal hernia    High cholesterol    Hx MRSA infection    nose   Hypertension    Prediabetes    Syncope    Past Surgical History:  Procedure Laterality Date   HERNIA REPAIR Right 01/2008   MOLE SURGERY  2014   basil cell   NOSE SURGERY     x 3-4   Current Outpatient Medications on File Prior to Visit  Medication Sig Dispense Refill   amLODipine (NORVASC) 5 MG tablet Take 1 tablet (5 mg total) by mouth daily. 90 tablet 1   atorvastatin (LIPITOR) 80 MG tablet TAKE 1 TABLET BY MOUTH EVERY DAY AT 6PM 90 tablet 3   empagliflozin (JARDIANCE) 25 MG TABS tablet TAKE 1 TABLET BY MOUTH DAILY BEFORE BREAKFAST. 90 tablet 1   ezetimibe (ZETIA) 10 MG tablet Take 1 tablet (10 mg total) by mouth daily. 90 tablet 3   fluticasone (FLONASE) 50 MCG/ACT nasal spray SPRAY 2 SPRAYS INTO EACH NOSTRIL EVERY EVENING 48 mL 1   icosapent Ethyl (VASCEPA) 1 g capsule Take 2 capsules (2 g total) by mouth 2 (two) times daily. 120 capsule 2   metoprolol succinate (TOPROL-XL) 50 MG 24 hr tablet TAKE 1 TABLET BY MOUTH EVERY DAY WITH OR IMMEDIATELY FOLLOWING A  MEAL 90 tablet 0   Multiple Vitamin (MULTIVITAMIN WITH MINERALS)  TABS tablet Take 1 tablet by mouth daily.     neomycin-polymyxin-hydrocortisone (CORTISPORIN) OTIC solution Place 3 drops into both ears 4 (four) times daily. 10 mL 0   omeprazole (PRILOSEC) 20 MG capsule TAKE 1 CAPSULE BY MOUTH 2 TIMES DAILY BEFORE A MEAL. 180 capsule 3   sildenafil (VIAGRA) 100 MG tablet Take 0.5-1 tablets (50-100 mg total) by mouth daily as needed for erectile dysfunction. (Patient not taking: Reported on 11/15/2022) 5 tablet 11   No current facility-administered medications on file prior to visit.   Allergies  Allergen Reactions   Amoxicillin Anaphylaxis    REACTION: Rash/shortness of breath   Codeine     REACTION: iritability   Oxycodone-Acetaminophen      Can not take 5-500mg  causes chest tightness and pain    Penicillins    Social History   Socioeconomic History   Marital status: Married    Spouse name: Not on file   Number of children: 2   Years of education: 12   Highest education level: Not on file  Occupational History    Employer: [employer]  Tobacco Use   Smoking status: Never   Smokeless tobacco: Never  Vaping Use   Vaping status: Never Used  Substance and Sexual Activity   Alcohol use: No   Drug use: No   Sexual activity: Not on file  Other Topics Concern   Not on file  Social History Narrative   Patient lives at home with his wife . 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 He denies smoking, drinking.   Social Determinants of Health   Financial Resource Strain: Not on file  Food Insecurity: Not on file  Transportation Needs: Not on file  Physical Activity: Not on file  Stress: Not on file  Social Connections: Unknown (08/10/2022)   Received from [facility]   Social Network    Social Network: Not on file  Intimate Partner Violence: Unknown (08/10/2022)   Received from [facility]   HITS     Physically Hurt: Not on file    Insult or Talk Down To: Not on file    Threaten Physical Harm: Not on file    Scream or Curse: Not on file      Review of Systems  All other systems reviewed and are negative.      Objective:   Physical Exam Vitals reviewed.  Constitutional:      General: He is not in acute distress.    Appearance: Normal appearance. He is well-developed and normal weight. He is not ill-appearing or toxic-appearing.  HENT:     Head: Normocephalic and atraumatic.     Right Ear: Tympanic membrane and ear canal normal.     Left Ear: Tympanic membrane and ear canal normal.     Nose: Nose normal. No congestion or rhinorrhea.     Mouth/Throat:     Mouth: Mucous membranes are moist.     Pharynx: Oropharynx is clear. No oropharyngeal exudate or posterior oropharyngeal erythema.  Eyes:     General:        Right eye: No discharge.        Left eye: No discharge.     Extraocular Movements: Extraocular movements intact.     Conjunctiva/sclera: Conjunctivae normal.     Pupils: Pupils are equal, round, and reactive to light.  Neck:     Vascular: No carotid bruit.  Cardiovascular:     Rate and Rhythm: Normal  rate and regular rhythm.     Heart sounds: Normal heart sounds. No murmur heard.    No friction rub. No gallop.  Pulmonary:     Effort: Pulmonary effort is normal. No respiratory distress.     Breath sounds: Normal breath sounds. No stridor. No wheezing, rhonchi or rales.  Abdominal:     General: Abdomen is flat. Bowel sounds are normal. There is no distension.     Palpations: Abdomen is soft. There is no mass.     Tenderness: There is no abdominal tenderness. There is no guarding or rebound.     Hernia: No hernia is present.  Musculoskeletal:     Cervical back: Normal range of motion and neck supple. No rigidity or tenderness.     Right lower leg: No swelling, deformity, tenderness or bony tenderness. No edema.     Left lower leg: No swelling, deformity,  tenderness or bony tenderness. No edema.  Lymphadenopathy:     Cervical: No cervical adenopathy.  Skin:    Coloration: Skin is not jaundiced or pale.     Findings: No bruising, erythema, lesion or rash.  Neurological:     General: No focal deficit present.     Mental Status: He is alert and oriented to person, place, and time. Mental status is at baseline.     Cranial Nerves: No cranial nerve deficit.     Sensory: No sensory deficit.     Motor: No weakness.     Coordination: Coordination normal.     Gait: Gait normal.     Deep Tendon Reflexes: Reflexes normal.  Psychiatric:        Mood and Affect: Mood normal.        Behavior: Behavior normal.        Thought Content: Thought content normal.           Assessment & Plan:  Needs flu shot - Plan: Flu vaccine trivalent PF, 6mos and older(Flulaval,Afluria,Fluarix,Fluzone)  General medical exam  Controlled type 2 diabetes mellitus without complication, without long-term current use of insulin (HCC)  Coronary artery disease involving native coronary artery of native heart without angina pectoris  Essential hypertension Patient's physical exam today is normal.  His blood pressure is excellent at 124/80.  His hemoglobin A1c was 6.1 August.  He is not quite due to check this again.  I would like to follow that every 6 months and I would recommend rechecking the hemoglobin A1c in February.  His cholesterol is excellent.  The remainder of his lab work is normal.  Colonoscopy is up-to-date.  PSA is due again in April.  Patient received his flu shot today.  His eye exam has been scheduled.  Diabetic foot exam was normal.  The patient received his flu shot but he declines 23

## 2022-12-20 ENCOUNTER — Other Ambulatory Visit: Payer: Self-pay | Admitting: Family Medicine

## 2022-12-20 DIAGNOSIS — I2510 Atherosclerotic heart disease of native coronary artery without angina pectoris: Secondary | ICD-10-CM

## 2022-12-20 DIAGNOSIS — E782 Mixed hyperlipidemia: Secondary | ICD-10-CM

## 2022-12-21 ENCOUNTER — Other Ambulatory Visit: Payer: Self-pay

## 2022-12-21 ENCOUNTER — Telehealth: Payer: Self-pay | Admitting: Family Medicine

## 2022-12-21 DIAGNOSIS — I10 Essential (primary) hypertension: Secondary | ICD-10-CM

## 2022-12-21 DIAGNOSIS — R0789 Other chest pain: Secondary | ICD-10-CM

## 2022-12-21 MED ORDER — METOPROLOL SUCCINATE ER 50 MG PO TB24: 50.0000 mg | ORAL_TABLET | Freq: Every day | ORAL | 0 refills | Status: DC

## 2022-12-21 NOTE — Telephone Encounter (Signed)
Prescription Request  12/21/2022  LOV: 11/15/2022  What is the name of the medication or equipment? metoprolol succinate (TOPROL-XL) 50 MG 24 hr tablet   Have you contacted your pharmacy to request a refill? Yes   Which pharmacy would you like this sent to?  [id number] [place], [place] - [address] [place] [address] Phone: [phone] Fax: [phone]    Patient notified that their request is being sent to the clinical staff for review and that they should receive a response within 2 business days.   Please advise at [facility] [phone]

## 2022-12-22 NOTE — Telephone Encounter (Signed)
OV 11/15/22- labs done before visit- provider reviewed Requested Prescriptions  Pending Prescriptions Disp Refills   atorvastatin (LIPITOR) 80 MG tablet [Pharmacy Med Name: ATORVASTATIN 80 MG TABLET] 90 tablet 3    Sig: TAKE 1 TABLET BY MOUTH EVERY DAY AT 6PM     Cardiovascular:  Antilipid - Statins Failed - 12/20/2022  1:31 AM      Failed - Valid encounter within last 12 months    Recent Outpatient Visits           1 year ago Encounter for hepatitis C screening test for low risk patient   [facility] Donita Brooks, MD   2 years ago Right lower quadrant abdominal pain   [facility] Tanya Nones, Priscille Heidelberg, MD   2 years ago Chronic fatigue   [facility] Donita Brooks, MD   3 years ago Abdominal pain, RLQ (right lower quadrant)   [facility] Tanya Nones, Priscille Heidelberg, MD   3 years ago Benign essential HTN   [facility] Pickard, Priscille Heidelberg, MD       Future Appointments             In 11 months Pickard, Priscille Heidelberg, MD [facility], [facility]            Failed - Lipid Panel in normal range within the last 12 months    Cholesterol, Total  Date Value Ref Range Status  12/14/2020 152 100 - 199 mg/dL Final   Cholesterol  Date Value Ref Range Status  11/11/2022 133 <200 mg/dL Final   LDL Cholesterol (Calc)  Date Value Ref Range Status  11/11/2022 77 mg/dL (calc) Final    Comment:    Reference range: <100 . Desirable range <100 mg/dL for primary prevention;   <70 mg/dL for patients with CHD or diabetic patients  with > or = 2 CHD risk factors. Marland Kitchen LDL-C is now calculated using the Martin-Hopkins  calculation, which is a validated novel method providing  better accuracy than the Friedewald equation in the  estimation of LDL-C.  Horald Pollen et al. Lenox Ahr. 4401;027(25): 2061-2068  (http://education.[facility].com/faq/FAQ164)    HDL  Date Value Ref Range Status  11/11/2022  35 (L) > OR = 40 mg/dL Final  36/64/4034 40 >74 mg/dL Final   Triglycerides  Date Value Ref Range Status  11/11/2022 124 <150 mg/dL Final         Passed - Patient is not pregnant

## 2023-01-25 ENCOUNTER — Other Ambulatory Visit: Payer: Self-pay | Admitting: Family Medicine

## 2023-01-25 DIAGNOSIS — E782 Mixed hyperlipidemia: Secondary | ICD-10-CM

## 2023-01-25 NOTE — Telephone Encounter (Signed)
 Requested medication (s) are due for refill today- yes  Requested medication (s) are on the active medication list -yes  Future visit scheduled -yes  Last refill: 11/01/22 #120 2RF  Notes to clinic: off protocol- provider review   Requested Prescriptions  Pending Prescriptions Disp Refills   VASCEPA  1 g capsule [Pharmacy Med Name: VASCEPA  1 GM CAPSULE] 120 capsule 2    Sig: TAKE 2 CAPSULES BY MOUTH 2 TIMES DAILY.     Off-Protocol Failed - 01/25/2023  8:31 AM      Failed - Medication not assigned to a protocol, review manually.      Failed - Valid encounter within last 12 months    Recent Outpatient Visits           1 year ago Encounter for hepatitis C screening test for low risk patient   [facility] Duanne Butler DASEN, MD   2 years ago Right lower quadrant abdominal pain   [facility] Duanne, Butler DASEN, MD   2 years ago Chronic fatigue   [facility] Duanne Butler DASEN, MD   3 years ago Abdominal pain, RLQ (right lower quadrant)   [facility] Duanne, Butler DASEN, MD   4 years ago Benign essential HTN   [facility] Pickard, Butler DASEN, MD       Future Appointments             In 9 months Pickard, Butler DASEN, MD [facility]               Requested Prescriptions  Pending Prescriptions Disp Refills   VASCEPA  1 g capsule [Pharmacy Med Name: VASCEPA  1 GM CAPSULE] 120 capsule 2    Sig: TAKE 2 CAPSULES BY MOUTH 2 TIMES DAILY.     Off-Protocol Failed - 01/25/2023  8:31 AM      Failed - Medication not assigned to a protocol, review manually.      Failed - Valid encounter within last 12 months    Recent Outpatient Visits           1 year ago Encounter for hepatitis C screening test for low risk patient   [facility] Duanne Butler DASEN, MD   2 years ago Right lower quadrant abdominal pain   [facility] Duanne, Butler DASEN, MD    2 years ago Chronic fatigue   [facility] Duanne Butler DASEN, MD   3 years ago Abdominal pain, RLQ (right lower quadrant)   [facility] Duanne Butler DASEN, MD   4 years ago Benign essential HTN   [facility] Pickard, Butler DASEN, MD       Future Appointments             In 9 months Pickard, Butler DASEN, MD [facility], [facility]

## 2023-01-26 ENCOUNTER — Other Ambulatory Visit: Payer: Self-pay

## 2023-01-26 ENCOUNTER — Other Ambulatory Visit: Payer: Self-pay | Admitting: Family Medicine

## 2023-01-26 ENCOUNTER — Telehealth: Payer: Self-pay

## 2023-01-26 DIAGNOSIS — E119 Type 2 diabetes mellitus without complications: Secondary | ICD-10-CM

## 2023-01-26 DIAGNOSIS — I10 Essential (primary) hypertension: Secondary | ICD-10-CM

## 2023-01-26 MED ORDER — EMPAGLIFLOZIN 25 MG PO TABS
25.0000 mg | ORAL_TABLET | Freq: Every day | ORAL | 1 refills | Status: DC
Start: 2023-01-26 — End: 2023-05-29

## 2023-01-26 NOTE — Telephone Encounter (Signed)
 Prescription Request  01/26/2023  LOV: 11/15/22  What is the name of the medication or equipment? empagliflozin  (JARDIANCE ) 25 MG TABS tablet [[id number]]   Have you contacted your pharmacy to request a refill? Yes   Which pharmacy would you like this sent to?  [id number] [address], [place] - [address] [place] [address] Phone: [phone] Fax: [phone]    Patient notified that their request is being sent to the clinical staff for review and that they should receive a response within 2 business days.   Please advise at [facility] [phone]

## 2023-01-27 ENCOUNTER — Other Ambulatory Visit: Payer: Self-pay

## 2023-01-27 DIAGNOSIS — E782 Mixed hyperlipidemia: Secondary | ICD-10-CM

## 2023-01-27 NOTE — Telephone Encounter (Signed)
 Prescription Request  01/27/2023  LOV: 11/15/22 CPE  What is the name of the medication or equipment? ezetimibe  (ZETIA ) 10 MG tablet [[id number]]   Have you contacted your pharmacy to request a refill? Yes   Which pharmacy would you like this sent to?  [id number] [address], [place] - [address] [place] [address] Phone: [phone] Fax: [phone]    Patient notified that their request is being sent to the clinical staff for review and that they should receive a response within 2 business days.   Please advise at [facility] [phone]

## 2023-01-30 MED ORDER — EZETIMIBE 10 MG PO TABS: 10.0000 mg | ORAL_TABLET | Freq: Every day | ORAL | 2 refills | Status: DC

## 2023-01-30 NOTE — Telephone Encounter (Signed)
 Requested Prescriptions  Pending Prescriptions Disp Refills   ezetimibe  (ZETIA ) 10 MG tablet 90 tablet 3    Sig: Take 1 tablet (10 mg total) by mouth daily.     Cardiovascular:  Antilipid - Sterol Transport Inhibitors Failed - 01/30/2023  3:03 PM      Failed - Valid encounter within last 12 months    Recent Outpatient Visits           1 year ago Encounter for hepatitis C screening test for low risk patient   [facility] Duanne Butler DASEN, MD   2 years ago Right lower quadrant abdominal pain   [facility] Duanne, Butler DASEN, MD   2 years ago Chronic fatigue   [facility] Duanne Butler DASEN, MD   3 years ago Abdominal pain, RLQ (right lower quadrant)   [facility] Duanne, Butler DASEN, MD   4 years ago Benign essential HTN   [facility] Pickard, Butler DASEN, MD       Future Appointments             In 9 months Pickard, Butler DASEN, MD [facility], [facility]            Failed - Lipid Panel in normal range within the last 12 months    Cholesterol, Total  Date Value Ref Range Status  12/14/2020 152 100 - 199 mg/dL Final   Cholesterol  Date Value Ref Range Status  11/11/2022 133 <200 mg/dL Final   LDL Cholesterol (Calc)  Date Value Ref Range Status  11/11/2022 77 mg/dL (calc) Final    Comment:    Reference range: <100 . Desirable range <100 mg/dL for primary prevention;   <70 mg/dL for patients with CHD or diabetic patients  with > or = 2 CHD risk factors. SABRA LDL-C is now calculated using the Martin-Hopkins  calculation, which is a validated novel method providing  better accuracy than the Friedewald equation in the  estimation of LDL-C.  Gladis APPLETHWAITE et al. SANDREA. 7986;689(80): 2061-2068  (http://education.[employer].com/faq/FAQ164)    HDL  Date Value Ref Range Status  11/11/2022 35 (L) > OR = 40 mg/dL Final  88/71/7977 40 >60 mg/dL Final   Triglycerides  Date  Value Ref Range Status  11/11/2022 124 <150 mg/dL Final         Passed - AST in normal range and within 360 days    AST  Date Value Ref Range Status  11/11/2022 19 10 - 35 U/L Final         Passed - ALT in normal range and within 360 days    ALT  Date Value Ref Range Status  11/11/2022 30 9 - 46 U/L Final         Passed - Patient is not pregnant

## 2023-03-19 ENCOUNTER — Other Ambulatory Visit: Payer: Self-pay | Admitting: Family Medicine

## 2023-03-19 DIAGNOSIS — R0789 Other chest pain: Secondary | ICD-10-CM

## 2023-03-19 DIAGNOSIS — I10 Essential (primary) hypertension: Secondary | ICD-10-CM

## 2023-05-18 ENCOUNTER — Other Ambulatory Visit: Payer: Self-pay | Admitting: Family Medicine

## 2023-05-18 DIAGNOSIS — E782 Mixed hyperlipidemia: Secondary | ICD-10-CM

## 2023-05-18 NOTE — Telephone Encounter (Signed)
 Copied from CRM [id number]. Topic: Clinical - Medication Refill >> May 18, 2023  9:52 AM Georgeann Kindred wrote: Most Recent Primary Care Visit:  Provider: Eliane Grooms T  [facility]  Visit Type: PHYSICAL  Date: 11/15/2022  Medication: icosapent  Ethyl (VASCEPA ) 1 g capsule  Has the patient contacted their pharmacy? Yes (Agent: If no, request that the patient contact the pharmacy for the refill. If patient does not wish to contact the pharmacy document the reason why and proceed with request.) (Agent: If yes, when and what did the pharmacy advise?) Pharmacy stated they have not heard a response from provider  Is this the correct pharmacy for this prescription? Yes If no, delete pharmacy and type the correct one.  This is the patient's preferred pharmacy:  [id number] [address], [place] - [address] [place] [address] Phone: [phone] Fax: [phone]   Has the prescription been filled recently? No  Is the patient out of the medication? No, last till Friday afternoon 05/02  Has the patient been seen for an appointment in the last year OR does the patient have an upcoming appointment? No  Can we respond through MyChart? Yes  Agent: Please be advised that Rx refills may take up to 3 business days. We ask that you follow-up with your pharmacy.

## 2023-05-19 MED ORDER — ICOSAPENT ETHYL 1 G PO CAPS: 2.0000 g | ORAL_CAPSULE | Freq: Two times a day (BID) | ORAL | 2 refills | Status: DC

## 2023-05-19 NOTE — Telephone Encounter (Signed)
 Requested medication (s) are due for refill today - yes  Requested medication (s) are on the active medication list -yes  Future visit scheduled -yes  Last refill: 11/01/22 #120 2RF  Notes to clinic: off protocol- provider review   Requested Prescriptions  Pending Prescriptions Disp Refills   icosapent  Ethyl (VASCEPA ) 1 g capsule 120 capsule 2    Sig: Take 2 capsules (2 g total) by mouth 2 (two) times daily.     Off-Protocol Failed - 05/19/2023 12:51 PM      Failed - Medication not assigned to a protocol, review manually.      Passed - Valid encounter within last 12 months    Recent Outpatient Visits           6 months ago Needs flu shot   [place] [facility] Pickard, Cisco Crest, MD   7 months ago Upper respiratory tract infection, unspecified type   [place] [facility] Austine Lefort, MD   8 months ago Tick bite of left upper arm, initial encounter   [place] [facility] Austine Lefort, MD   1 year ago Injury of muscle or tendon of right rotator cuff   [facility] Austine Lefort, MD   1 year ago Viral URI   [place] [facility] Jenelle Mis, FNP       Future Appointments             In 6 months Cheril Cork, Cisco Crest, MD [facility] Family Medicine, [facility]               Requested Prescriptions  Pending Prescriptions Disp Refills   icosapent  Ethyl (VASCEPA ) 1 g capsule 120 capsule 2    Sig: Take 2 capsules (2 g total) by mouth 2 (two) times daily.     Off-Protocol Failed - 05/19/2023 12:51 PM      Failed - Medication not assigned to a protocol, review manually.      Passed - Valid encounter within last 12 months    Recent Outpatient Visits           6 months ago Needs flu shot   [place] [facility] Pickard, Cisco Crest, MD   7 months ago Upper respiratory tract infection, unspecified type   [place] [facility] Medicine Austine Lefort, MD   8 months ago Tick bite of left upper arm, initial encounter   [place] [facility] Austine Lefort, MD   1 year ago Injury of muscle or tendon of right rotator cuff   [place] Family Medicine Austine Lefort, MD   1 year ago Viral URI    [facility] Jenelle Mis, FNP       Future Appointments             In 6 months Pickard, Cisco Crest, MD [facility], [facility]

## 2023-05-29 ENCOUNTER — Ambulatory Visit: Payer: Self-pay | Admitting: Family Medicine

## 2023-05-29 ENCOUNTER — Encounter: Payer: Self-pay | Admitting: Family Medicine

## 2023-05-29 DIAGNOSIS — E782 Mixed hyperlipidemia: Secondary | ICD-10-CM | POA: Diagnosis not present

## 2023-05-29 DIAGNOSIS — I2510 Atherosclerotic heart disease of native coronary artery without angina pectoris: Secondary | ICD-10-CM | POA: Diagnosis not present

## 2023-05-29 DIAGNOSIS — R0789 Other chest pain: Secondary | ICD-10-CM

## 2023-05-29 DIAGNOSIS — E119 Type 2 diabetes mellitus without complications: Secondary | ICD-10-CM

## 2023-05-29 DIAGNOSIS — Z7984 Long term (current) use of oral hypoglycemic drugs: Secondary | ICD-10-CM

## 2023-05-29 DIAGNOSIS — E1169 Type 2 diabetes mellitus with other specified complication: Secondary | ICD-10-CM

## 2023-05-29 DIAGNOSIS — I10 Essential (primary) hypertension: Secondary | ICD-10-CM

## 2023-05-29 MED ORDER — SILDENAFIL CITRATE 100 MG PO TABS
50.0000 mg | ORAL_TABLET | Freq: Every day | ORAL | 11 refills | Status: AC | PRN
Start: 2023-05-29 — End: ?

## 2023-05-29 MED ORDER — AMLODIPINE BESYLATE 5 MG PO TABS: 5.0000 mg | ORAL_TABLET | Freq: Every day | ORAL | 1 refills | Status: DC

## 2023-05-29 MED ORDER — EMPAGLIFLOZIN 25 MG PO TABS: 25.0000 mg | ORAL_TABLET | Freq: Every day | ORAL | 1 refills | Status: DC

## 2023-05-29 MED ORDER — OMEPRAZOLE 20 MG PO CPDR
20.0000 mg | DELAYED_RELEASE_CAPSULE | Freq: Two times a day (BID) | ORAL | 3 refills | Status: AC
Start: 2023-05-29 — End: ?

## 2023-05-29 MED ORDER — ICOSAPENT ETHYL 1 G PO CAPS
2.0000 g | ORAL_CAPSULE | Freq: Two times a day (BID) | ORAL | 2 refills | Status: DC
Start: 2023-05-29 — End: 2023-11-02

## 2023-05-29 MED ORDER — ATORVASTATIN CALCIUM 80 MG PO TABS: ORAL_TABLET | ORAL | 3 refills | Status: DC

## 2023-05-29 MED ORDER — EZETIMIBE 10 MG PO TABS: 10.0000 mg | ORAL_TABLET | Freq: Every day | ORAL | 2 refills | Status: AC

## 2023-05-29 MED ORDER — FLUTICASONE PROPIONATE 50 MCG/ACT NA SUSP: NASAL | 1 refills | Status: DC

## 2023-05-29 MED ORDER — METOPROLOL SUCCINATE ER 50 MG PO TB24: 50.0000 mg | ORAL_TABLET | Freq: Every day | ORAL | 0 refills | Status: DC

## 2023-05-29 NOTE — Progress Notes (Signed)
 Subjective:    Patient ID: Nathaniel Porter, male    DOB: 1960-08-12, 63 y.o.   MRN: [id number]  Patient has a history of type 2 diabetes.  He is currently on Jardiance .  He denies any polyuria polydipsia or blurry vision.  His last hemoglobin A1c was 6.1.  He is due to recheck that as this has been more than 6 months.  His blood pressure is well-controlled at 120/62.  He denies any chest pain, shortness of breath, dyspnea on exertion.  He is complaining of some pain in his right shoulder in the rotator cuff.  He has seen orthopedics and they recommended physical therapy but this has not been beneficial. Past Medical History:  Diagnosis Date   Barrett esophagus    BCC (basal cell carcinoma)    Cerebral cyst    Chronic otitis media of right ear    GERD (gastroesophageal reflux disease)    H/O hematuria    Hearing loss on right    Hiatal hernia    High cholesterol    Hx MRSA infection    nose   Hypertension    Prediabetes    Syncope    Past Surgical History:  Procedure Laterality Date   HERNIA REPAIR Right 01/2008   MOLE SURGERY  2014   basil cell   NOSE SURGERY     x 3-4   Current Outpatient Medications on File Prior to Visit  Medication Sig Dispense Refill   Multiple Vitamin (MULTIVITAMIN WITH MINERALS) TABS tablet Take 1 tablet by mouth daily.     neomycin -polymyxin-hydrocortisone (CORTISPORIN) OTIC solution Place 3 drops into both ears 4 (four) times daily. 10 mL 0   No current facility-administered medications on file prior to visit.   Allergies  Allergen Reactions   Amoxicillin Anaphylaxis    REACTION: Rash/shortness of breath   Codeine     REACTION: iritability   Oxycodone-Acetaminophen       Can not take 5-500mg  causes chest tightness and pain    Penicillins    Social History   Socioeconomic History   Marital status: Married    Spouse name: Not on file   Number of children: 2   Years of education: 12   Highest education level: Some college, no degree   Occupational History    Employer: [employer]  Tobacco Use   Smoking status: Never   Smokeless tobacco: Never  Vaping Use   Vaping status: Never Used  Substance and Sexual Activity   Alcohol use: No   Drug use: No   Sexual activity: Not on file  Other Topics Concern   Not on file  Social History Narrative   Patient lives at home with his wife . 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 He denies smoking, drinking.   Social Drivers of Health   Financial Resource Strain: Patient Declined (05/25/2023)   Overall Financial Resource Strain (CARDIA)    Difficulty of Paying Living Expenses: Patient declined  Food Insecurity: Patient Declined (05/25/2023)   Hunger Vital Sign    Worried About Running Out of Food in the Last Year: Patient declined    Ran Out of Food in the Last Year: Patient declined  Transportation Needs: No Transportation Needs (05/25/2023)   PRAPARE - Administrator, Civil Service (Medical): No    Lack of Transportation (Non-Medical): No  Physical Activity: Insufficiently Active (05/25/2023)   Exercise Vital Sign    Days of Exercise per  Week: 4 days    Minutes of Exercise per Session: 30 min  Stress: No Stress Concern Present (05/25/2023)   [employer] of Occupational Health - Occupational Stress Questionnaire    Feeling of Stress : Not at all  Social Connections: Socially Integrated (05/25/2023)   Social Connection and Isolation Panel [NHANES]    Frequency of Communication with Friends and Family: More than three times a week    Frequency of Social Gatherings with Friends and Family: Three times a week    Attends Religious Services: More than 4 times per year    Active Member of Clubs or Organizations: Yes    Attends Banker Meetings: More than 4 times per year    Marital Status: Married  Catering manager Violence: Unknown (08/10/2022)   Received from [facility]   HITS     Physically Hurt: Not on file    Insult or Talk Down To: Not on file    Threaten Physical Harm: Not on file    Scream or Curse: Not on file      Review of Systems  All other systems reviewed and are negative.      Objective:   Physical Exam Vitals reviewed.  Constitutional:      General: He is not in acute distress.    Appearance: Normal appearance. He is well-developed and normal weight. He is not ill-appearing or toxic-appearing.  Cardiovascular:     Rate and Rhythm: Normal rate and regular rhythm.     Heart sounds: Normal heart sounds. No murmur heard.    No friction rub. No gallop.  Pulmonary:     Effort: Pulmonary effort is normal. No respiratory distress.     Breath sounds: Normal breath sounds. No stridor. No wheezing.  Genitourinary:    Prostate: Enlarged. Not tender and no nodules present.  Musculoskeletal:     Right lower leg: No swelling. No edema.     Left lower leg: No swelling. No edema.  Neurological:     Mental Status: He is alert.           Assessment & Plan:  Essential hypertension - Plan: amLODipine  (NORVASC ) 5 MG tablet, metoprolol  succinate (TOPROL -XL) 50 MG 24 hr tablet  Mixed hyperlipidemia - Plan: atorvastatin  (LIPITOR) 80 MG tablet, ezetimibe  (ZETIA ) 10 MG tablet, icosapent  Ethyl (VASCEPA ) 1 g capsule  Coronary artery disease involving native coronary artery of native heart without angina pectoris - Plan: atorvastatin  (LIPITOR) 80 MG tablet  Controlled type 2 diabetes mellitus without complication, without long-term current use of insulin (HCC) - Plan: empagliflozin  (JARDIANCE ) 25 MG TABS tablet, Hemoglobin A1c, CBC with Differential/Platelet, Comprehensive metabolic panel with GFR, Lipid panel, Hemoglobin A1c, Microalbumin/Creatinine Ratio, Urine  Atypical chest pain - Plan: metoprolol  succinate (TOPROL -XL) 50 MG 24 hr tablet I am very happy with his blood pressure.  I will check a CBC CMP lipid panel and A1c and a urine protein  creatinine ratio.  Ideally I would like to see his LDL cholesterol less than 70.  I would like to see his A1c less than 6.5.  I would like to see his microalbumin to creatinine ratio less than 30.

## 2023-05-30 ENCOUNTER — Ambulatory Visit: Payer: Self-pay | Admitting: Family Medicine

## 2023-05-30 LAB — CBC WITH DIFFERENTIAL/PLATELET
Absolute Lymphocytes: 1800 {cells}/uL (ref 850–3900)
Absolute Monocytes: 637 {cells}/uL (ref 200–950)
Basophils Absolute: 30 {cells}/uL (ref 0–200)
Basophils Relative: 0.5 %
Eosinophils Absolute: 159 {cells}/uL (ref 15–500)
Eosinophils Relative: 2.7 %
HCT: 48.2 % (ref 38.5–50.0)
Hemoglobin: 15.8 g/dL (ref 13.2–17.1)
MCH: 29.4 pg (ref 27.0–33.0)
MCHC: 32.8 g/dL (ref 32.0–36.0)
MCV: 89.8 fL (ref 80.0–100.0)
MPV: 10.4 fL (ref 7.5–12.5)
Monocytes Relative: 10.8 %
Neutro Abs: 3275 {cells}/uL (ref 1500–7800)
Neutrophils Relative %: 55.5 %
Platelets: 229 10*3/uL (ref 140–400)
RBC: 5.37 10*6/uL (ref 4.20–5.80)
RDW: 12.5 % (ref 11.0–15.0)
Total Lymphocyte: 30.5 %
WBC: 5.9 10*3/uL (ref 3.8–10.8)

## 2023-05-30 LAB — MICROALBUMIN / CREATININE URINE RATIO
Creatinine, Urine: 82 mg/dL (ref 20–320)
Microalb, Ur: <0.2 mg/dL

## 2023-05-30 LAB — COMPREHENSIVE METABOLIC PANEL WITH GFR
AG Ratio: 2.2 (calc) (ref 1.0–2.5)
ALT: 40 U/L (ref 9–46)
AST: 25 U/L (ref 10–35)
Albumin: 4.4 g/dL (ref 3.6–5.1)
Alkaline phosphatase (APISO): 84 U/L (ref 35–144)
BUN: 17 mg/dL (ref 7–25)
CO2: 29 mmol/L (ref 20–32)
Calcium: 9.2 mg/dL (ref 8.6–10.3)
Chloride: 106 mmol/L (ref 98–110)
Creat: 1.07 mg/dL (ref 0.70–1.35)
Globulin: 2 g/dL (ref 1.9–3.7)
Glucose, Bld: 99 mg/dL (ref 65–99)
Potassium: 4.5 mmol/L (ref 3.5–5.3)
Sodium: 141 mmol/L (ref 135–146)
Total Bilirubin: 1 mg/dL (ref 0.2–1.2)
Total Protein: 6.4 g/dL (ref 6.1–8.1)
eGFR: 77 mL/min/{1.73_m2} (ref >=60)

## 2023-05-30 LAB — LIPID PANEL
Cholesterol: 123 mg/dL (ref <200)
HDL: 37 mg/dL — ABNORMAL LOW (ref >=40)
LDL Cholesterol (Calc): 69 mg/dL
Non-HDL Cholesterol (Calc): 86 mg/dL (ref <130)
Total CHOL/HDL Ratio: 3.3 (calc) (ref <5.0)
Triglycerides: 89 mg/dL (ref <150)

## 2023-05-30 LAB — HEMOGLOBIN A1C
Hgb A1c MFr Bld: 6.4 % — ABNORMAL HIGH (ref <5.7)
Mean Plasma Glucose: 137 mg/dL
eAG (mmol/L): 7.6 mmol/L

## 2023-07-28 ENCOUNTER — Telehealth: Payer: Self-pay

## 2023-07-28 NOTE — Telephone Encounter (Signed)
 Copied from CRM [id number]. Topic: Clinical - Medical Advice >> Jul 28, 2023 12:22 PM Taleah C wrote: Reason for CRM: pt called & stated that he received a message on MyChart stating that he needs to get his sodium levels checked but he already checked it on 05/30/23 and it came back at 141. He asked for a callback to confirm if he really needs this done or not.

## 2023-08-09 ENCOUNTER — Encounter: Payer: Self-pay | Admitting: Family Medicine

## 2023-08-10 ENCOUNTER — Other Ambulatory Visit: Payer: Self-pay

## 2023-08-10 MED ORDER — ROSUVASTATIN CALCIUM 40 MG PO TABS: 40.0000 mg | ORAL_TABLET | Freq: Every day | ORAL | 3 refills | Status: AC

## 2023-08-23 ENCOUNTER — Other Ambulatory Visit: Payer: Self-pay | Admitting: Family Medicine

## 2023-08-23 DIAGNOSIS — I10 Essential (primary) hypertension: Secondary | ICD-10-CM

## 2023-08-23 DIAGNOSIS — R0789 Other chest pain: Secondary | ICD-10-CM

## 2023-10-16 ENCOUNTER — Encounter: Payer: Self-pay | Admitting: Family Medicine

## 2023-10-16 ENCOUNTER — Ambulatory Visit: Admitting: Family Medicine

## 2023-10-16 VITALS — BP 124/78 | HR 71 | Temp 98.1°F | Ht 67.0 in | Wt 165.0 lb

## 2023-10-16 DIAGNOSIS — M25511 Pain in right shoulder: Secondary | ICD-10-CM

## 2023-10-16 DIAGNOSIS — G8929 Other chronic pain: Secondary | ICD-10-CM | POA: Diagnosis not present

## 2023-10-16 MED ORDER — PREDNISONE 20 MG PO TABS: ORAL_TABLET | ORAL | 0 refills | Status: DC

## 2023-10-16 NOTE — Progress Notes (Signed)
 Subjective:    Patient ID: Nathaniel Porter, male    DOB: Dec 27, 1960, 63 y.o.   MRN: [id number]  Patient presents today for pain in his right shoulder.  He reports pain with abduction greater than 90 degrees.  Shoulder aches and throbs at night.  In the past I have given him cortisone shots.  He estimates that he is in 3 cortisone shots over the years.  However he has an appointment to see an orthopedist later this month and he does not want the shot to interfere with their assessment.  He is looking for some short-term relief until he is able to see the orthopedist. Past Medical History:  Diagnosis Date   Barrett esophagus    BCC (basal cell carcinoma)    Cerebral cyst    Chronic otitis media of right ear    GERD (gastroesophageal reflux disease)    H/O hematuria    Hearing loss on right    Hiatal hernia    High cholesterol    Hx MRSA infection    nose   Hypertension    Prediabetes    Syncope    Past Surgical History:  Procedure Laterality Date   HERNIA REPAIR Right 01/2008   MOLE SURGERY  2014   basil cell   NOSE SURGERY     x 3-4   Current Outpatient Medications on File Prior to Visit  Medication Sig Dispense Refill   amLODipine  (NORVASC ) 5 MG tablet Take 1 tablet (5 mg total) by mouth daily. 90 tablet 1   empagliflozin  (JARDIANCE ) 25 MG TABS tablet Take 1 tablet (25 mg total) by mouth daily before breakfast. 90 tablet 1   ezetimibe  (ZETIA ) 10 MG tablet Take 1 tablet (10 mg total) by mouth daily. 90 tablet 2   fluticasone  (FLONASE ) 50 MCG/ACT nasal spray SPRAY 2 SPRAYS INTO EACH NOSTRIL EVERY EVENING 48 mL 1   icosapent  Ethyl (VASCEPA ) 1 g capsule Take 2 capsules (2 g total) by mouth 2 (two) times daily. 120 capsule 2   metoprolol  succinate (TOPROL -XL) 50 MG 24 hr tablet TAKE 1 TABLET BY MOUTH DAILY. TAKE WITH OR IMMEDIATELY FOLLOWING A MEAL. 90 tablet 0   Multiple Vitamin (MULTIVITAMIN WITH MINERALS) TABS tablet Take 1 tablet by mouth daily.      neomycin -polymyxin-hydrocortisone (CORTISPORIN) OTIC solution Place 3 drops into both ears 4 (four) times daily. 10 mL 0   omeprazole  (PRILOSEC ) 20 MG capsule Take 1 capsule (20 mg total) by mouth 2 (two) times daily before a meal. 180 capsule 3   rosuvastatin  (CRESTOR ) 40 MG tablet Take 1 tablet (40 mg total) by mouth daily. 90 tablet 3   sildenafil  (VIAGRA ) 100 MG tablet Take 0.5-1 tablets (50-100 mg total) by mouth daily as needed for erectile dysfunction. 5 tablet 11   No current facility-administered medications on file prior to visit.   Allergies  Allergen Reactions   Amoxicillin Anaphylaxis    REACTION: Rash/shortness of breath   Codeine     REACTION: iritability   Oxycodone-Acetaminophen       Can not take 5-500mg  causes chest tightness and pain    Penicillins    Social History   Socioeconomic History   Marital status: Married    Spouse name: Not on file   Number of children: 2   Years of education: 12   Highest education level: Some college, no degree  Occupational History    Employer: [employer]  Tobacco Use   Smoking status: Never   Smokeless tobacco:  Never  Vaping Use   Vaping status: Never Used  Substance and Sexual Activity   Alcohol use: No   Drug use: No   Sexual activity: Not on file  Other Topics Concern   Not on file  Social History Narrative   Patient lives at home with his wife . 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 He denies smoking, drinking.   Social Drivers of Health   Financial Resource Strain: Patient Declined (05/25/2023)   Overall Financial Resource Strain (CARDIA)    Difficulty of Paying Living Expenses: Patient declined  Food Insecurity: Patient Declined (05/25/2023)   Hunger Vital Sign    Worried About Running Out of Food in the Last Year: Patient declined    Ran Out of Food in the Last Year: Patient declined  Transportation Needs: No Transportation Needs (05/25/2023)    PRAPARE - Administrator, Civil Service (Medical): No    Lack of Transportation (Non-Medical): No  Physical Activity: Insufficiently Active (05/25/2023)   Exercise Vital Sign    Days of Exercise per Week: 4 days    Minutes of Exercise per Session: 30 min  Stress: No Stress Concern Present (05/25/2023)   [employer] of Occupational Health - Occupational Stress Questionnaire    Feeling of Stress : Not at all  Social Connections: Socially Integrated (05/25/2023)   Social Connection and Isolation Panel    Frequency of Communication with Friends and Family: More than three times a week    Frequency of Social Gatherings with Friends and Family: Three times a week    Attends Religious Services: More than 4 times per year    Active Member of Clubs or Organizations: Yes    Attends Banker Meetings: More than 4 times per year    Marital Status: Married  Catering manager Violence: Unknown (08/10/2022)   Received from [facility]   HITS    Physically Hurt: Not on file    Insult or Talk Down To: Not on file    Threaten Physical Harm: Not on file    Scream or Curse: Not on file      Review of Systems  All other systems reviewed and are negative.      Objective:   Physical Exam Vitals reviewed.  Constitutional:      General: He is not in acute distress.    Appearance: Normal appearance. He is well-developed and normal weight. He is not ill-appearing or toxic-appearing.  HENT:     Nose:     Right Sinus: No maxillary sinus tenderness or frontal sinus tenderness.     Left Sinus: No maxillary sinus tenderness or frontal sinus tenderness.  Cardiovascular:     Rate and Rhythm: Normal rate and regular rhythm.     Heart sounds: Normal heart sounds. No murmur heard.    No friction rub. No gallop.  Pulmonary:     Effort: Pulmonary effort is normal. No respiratory distress.     Breath sounds: Normal breath sounds. No stridor. No wheezing.  Musculoskeletal:      Right shoulder: Tenderness present. Decreased range of motion.  Neurological:     Mental Status: He is alert.           Assessment & Plan:  Chronic right shoulder pain I offered the patient a cortisone injection into the right subacromial space for pain relief.  However patient would like to see orthopedist later this month and does not want  the injection to mask the problem given the fact it continues to return.  Therefore I offered the patient a prednisone  taper by to achieve temporary relief until he can get  to the ppointment to see the orthopedist

## 2023-11-02 ENCOUNTER — Other Ambulatory Visit: Payer: Self-pay

## 2023-11-02 ENCOUNTER — Telehealth: Payer: Self-pay

## 2023-11-02 DIAGNOSIS — E782 Mixed hyperlipidemia: Secondary | ICD-10-CM

## 2023-11-02 MED ORDER — ICOSAPENT ETHYL 1 G PO CAPS: 2.0000 g | ORAL_CAPSULE | Freq: Two times a day (BID) | ORAL | 2 refills | Status: DC

## 2023-11-02 NOTE — Telephone Encounter (Signed)
 Prescription Request  11/02/2023  LOV: 08/15/23 UPCOMING CPE 11/17/23  What is the name of the medication or equipment? icosapent  Ethyl (VASCEPA ) 1 g capsule [[id number]]   Have you contacted your pharmacy to request a refill? Yes   Which pharmacy would you like this sent to?  [id number] [address], [place] - [address]  [place] [address] Phone: [phone] Fax: [phone]    Patient notified that their request is being sent to the clinical staff for review and that they should receive a response within 2 business days.   Please advise at [facility] [phone]

## 2023-11-02 NOTE — Telephone Encounter (Signed)
 Sent in medication

## 2023-11-13 ENCOUNTER — Other Ambulatory Visit: Payer: BC Managed Care – PPO

## 2023-11-13 DIAGNOSIS — I2510 Atherosclerotic heart disease of native coronary artery without angina pectoris: Secondary | ICD-10-CM

## 2023-11-13 DIAGNOSIS — I10 Essential (primary) hypertension: Secondary | ICD-10-CM

## 2023-11-13 DIAGNOSIS — E782 Mixed hyperlipidemia: Secondary | ICD-10-CM

## 2023-11-13 DIAGNOSIS — Z0000 Encounter for general adult medical examination without abnormal findings: Secondary | ICD-10-CM

## 2023-11-13 DIAGNOSIS — E119 Type 2 diabetes mellitus without complications: Secondary | ICD-10-CM

## 2023-11-14 ENCOUNTER — Ambulatory Visit: Payer: Self-pay | Admitting: Family Medicine

## 2023-11-14 DIAGNOSIS — M25511 Pain in right shoulder: Secondary | ICD-10-CM | POA: Insufficient documentation

## 2023-11-14 DIAGNOSIS — M7531 Calcific tendinitis of right shoulder: Secondary | ICD-10-CM | POA: Insufficient documentation

## 2023-11-14 LAB — HEMOGLOBIN A1C
Hgb A1c MFr Bld: 6.2 % — ABNORMAL HIGH (ref <5.7)
Mean Plasma Glucose: 131 mg/dL
eAG (mmol/L): 7.3 mmol/L

## 2023-11-14 LAB — COMPLETE METABOLIC PANEL WITHOUT GFR
AG Ratio: 2 (calc) (ref 1.0–2.5)
ALT: 35 U/L (ref 9–46)
AST: 23 U/L (ref 10–35)
Albumin: 4.4 g/dL (ref 3.6–5.1)
Alkaline phosphatase (APISO): 71 U/L (ref 35–144)
BUN: 16 mg/dL (ref 7–25)
CO2: 28 mmol/L (ref 20–32)
Calcium: 9.1 mg/dL (ref 8.6–10.3)
Chloride: 105 mmol/L (ref 98–110)
Creat: 1.03 mg/dL (ref 0.70–1.35)
Globulin: 2.2 g/dL (ref 1.9–3.7)
Glucose, Bld: 108 mg/dL — ABNORMAL HIGH (ref 65–99)
Potassium: 4.3 mmol/L (ref 3.5–5.3)
Sodium: 142 mmol/L (ref 135–146)
Total Bilirubin: 0.8 mg/dL (ref 0.2–1.2)
Total Protein: 6.6 g/dL (ref 6.1–8.1)

## 2023-11-14 LAB — LIPID PANEL
Cholesterol: 126 mg/dL (ref <200)
HDL: 42 mg/dL (ref >=40)
LDL Cholesterol (Calc): 60 mg/dL
Non-HDL Cholesterol (Calc): 84 mg/dL (ref <130)
Total CHOL/HDL Ratio: 3 (calc) (ref <5.0)
Triglycerides: 161 mg/dL — ABNORMAL HIGH (ref <150)

## 2023-11-14 LAB — CBC WITH DIFFERENTIAL/PLATELET
Absolute Lymphocytes: 1605 {cells}/uL (ref 850–3900)
Absolute Monocytes: 626 {cells}/uL (ref 200–950)
Basophils Absolute: 27 {cells}/uL (ref 0–200)
Basophils Relative: 0.4 %
Eosinophils Absolute: 177 {cells}/uL (ref 15–500)
Eosinophils Relative: 2.6 %
HCT: 49.4 % (ref 38.5–50.0)
Hemoglobin: 15.8 g/dL (ref 13.2–17.1)
MCH: 29.2 pg (ref 27.0–33.0)
MCHC: 32 g/dL (ref 32.0–36.0)
MCV: 91.1 fL (ref 80.0–100.0)
MPV: 10.1 fL (ref 7.5–12.5)
Monocytes Relative: 9.2 %
Neutro Abs: 4366 {cells}/uL (ref 1500–7800)
Neutrophils Relative %: 64.2 %
Platelets: 227 Thousand/uL (ref 140–400)
RBC: 5.42 Million/uL (ref 4.20–5.80)
RDW: 12.5 % (ref 11.0–15.0)
Total Lymphocyte: 23.6 %
WBC: 6.8 Thousand/uL (ref 3.8–10.8)

## 2023-11-14 LAB — VITAMIN B12: Vitamin B-12: 390 pg/mL (ref 200–1100)

## 2023-11-17 ENCOUNTER — Encounter: Payer: Self-pay | Admitting: Family Medicine

## 2023-11-17 ENCOUNTER — Ambulatory Visit: Payer: BC Managed Care – PPO | Admitting: Family Medicine

## 2023-11-17 ENCOUNTER — Telehealth: Payer: Self-pay

## 2023-11-17 VITALS — BP 120/60 | HR 70 | Temp 98.6°F | Ht 67.0 in | Wt 165.0 lb

## 2023-11-17 DIAGNOSIS — N401 Enlarged prostate with lower urinary tract symptoms: Secondary | ICD-10-CM

## 2023-11-17 DIAGNOSIS — Z125 Encounter for screening for malignant neoplasm of prostate: Secondary | ICD-10-CM

## 2023-11-17 DIAGNOSIS — E119 Type 2 diabetes mellitus without complications: Secondary | ICD-10-CM

## 2023-11-17 DIAGNOSIS — I2510 Atherosclerotic heart disease of native coronary artery without angina pectoris: Secondary | ICD-10-CM

## 2023-11-17 DIAGNOSIS — N5089 Other specified disorders of the male genital organs: Secondary | ICD-10-CM

## 2023-11-17 DIAGNOSIS — I10 Essential (primary) hypertension: Secondary | ICD-10-CM | POA: Diagnosis not present

## 2023-11-17 DIAGNOSIS — Z23 Encounter for immunization: Secondary | ICD-10-CM

## 2023-11-17 DIAGNOSIS — Z7984 Long term (current) use of oral hypoglycemic drugs: Secondary | ICD-10-CM

## 2023-11-17 DIAGNOSIS — Z0000 Encounter for general adult medical examination without abnormal findings: Secondary | ICD-10-CM

## 2023-11-17 NOTE — Addendum Note (Signed)
 Addended by: ANGELENA RONAL BRADLEY K on: 11/17/2023 11:27 AM   Modules accepted: Orders

## 2023-11-17 NOTE — Progress Notes (Signed)
 Subjective:    Patient ID: Nathaniel Porter, male    DOB: July 18, 1960, 63 y.o.   MRN: [id number]  Patient is here today for complete physical exam.  Patient states that he had a colonoscopy in August 2024.  I do not have a record of this.  He states that he has the records at home and he can bring me a copy but that everything was normal.  He is due for a PSA to screen for prostate cancer.  He is due for the pneumonia vaccine, a flu shot.  The shingles vaccine is up-to-date.  We discussed COVID and RSV.  He declines those today but he is interested in the pneumonia vaccine.  His most recent lab work is listed below.  He is concerned about swelling in his scrotum Lab on 11/13/2023  Component Date Value Ref Range Status   WBC 11/13/2023 6.8  3.8 - 10.8 Thousand/uL Final   RBC 11/13/2023 5.42  4.20 - 5.80 Million/uL Final   Hemoglobin 11/13/2023 15.8  13.2 - 17.1 g/dL Final   HCT 89/72/7974 49.4  38.5 - 50.0 % Final   MCV 11/13/2023 91.1  80.0 - 100.0 fL Final   MCH 11/13/2023 29.2  27.0 - 33.0 pg Final   MCHC 11/13/2023 32.0  32.0 - 36.0 g/dL Final   Comment: For adults, a slight decrease in the calculated MCHC value (in the range of 30 to 32 g/dL) is most likely not clinically significant; however, it should be interpreted with caution in correlation with other red cell parameters and the patient's clinical condition.    RDW 11/13/2023 12.5  11.0 - 15.0 % Final   Platelets 11/13/2023 227  140 - 400 Thousand/uL Final   MPV 11/13/2023 10.1  7.5 - 12.5 fL Final   Neutro Abs 11/13/2023 4,366  1,500 - 7,800 cells/uL Final   Absolute Lymphocytes 11/13/2023 1,605  850 - 3,900 cells/uL Final   Absolute Monocytes 11/13/2023 626  200 - 950 cells/uL Final   Eosinophils Absolute 11/13/2023 177  15 - 500 cells/uL Final   Basophils Absolute 11/13/2023 27  0 - 200 cells/uL Final   Neutrophils Relative % 11/13/2023 64.2  % Final   Total Lymphocyte 11/13/2023 23.6  % Final   Monocytes Relative  11/13/2023 9.2  % Final   Eosinophils Relative 11/13/2023 2.6  % Final   Basophils Relative 11/13/2023 0.4  % Final   Glucose, Bld 11/13/2023 108 (H)  65 - 99 mg/dL Final   Comment: .            Fasting reference interval . For someone without known diabetes, a glucose value between 100 and 125 mg/dL is consistent with prediabetes and should be confirmed with a follow-up test. .    BUN 11/13/2023 16  7 - 25 mg/dL Final   Creat 89/72/7974 1.03  0.70 - 1.35 mg/dL Final   BUN/Creatinine Ratio 11/13/2023 SEE NOTE:  6 - 22 (calc) Final   Comment:    Not Reported: BUN and Creatinine are within    reference range. .    Sodium 11/13/2023 142  135 - 146 mmol/L Final   Potassium 11/13/2023 4.3  3.5 - 5.3 mmol/L Final   Chloride 11/13/2023 105  98 - 110 mmol/L Final   CO2 11/13/2023 28  20 - 32 mmol/L Final   Calcium  11/13/2023 9.1  8.6 - 10.3 mg/dL Final   Total Protein 89/72/7974 6.6  6.1 - 8.1 g/dL Final   Albumin 89/72/7974 4.4  3.6 - 5.1 g/dL Final   Globulin 89/72/7974 2.2  1.9 - 3.7 g/dL (calc) Final   AG Ratio 11/13/2023 2.0  1.0 - 2.5 (calc) Final   Total Bilirubin 11/13/2023 0.8  0.2 - 1.2 mg/dL Final   Alkaline phosphatase (APISO) 11/13/2023 71  35 - 144 U/L Final   AST 11/13/2023 23  10 - 35 U/L Final   ALT 11/13/2023 35  9 - 46 U/L Final   Cholesterol 11/13/2023 126  <200 mg/dL Final   HDL 89/72/7974 42  > OR = 40 mg/dL Final   Triglycerides 89/72/7974 161 (H)  <150 mg/dL Final   LDL Cholesterol (Calc) 11/13/2023 60  mg/dL (calc) Final   Comment: Reference range: <100 . Desirable range <100 mg/dL for primary prevention;   <70 mg/dL for patients with CHD or diabetic patients  with > or = 2 CHD risk factors. SABRA LDL-C is now calculated using the Martin-Hopkins  calculation, which is a validated novel method providing  better accuracy than the Friedewald equation in the  estimation of LDL-C.  Gladis APPLETHWAITE et al. SANDREA. 7986;689(80): 2061-2068   (http://education.[facility].com/faq/FAQ164)    Total CHOL/HDL Ratio 11/13/2023 3.0  <4.9 (calc) Final   Non-HDL Cholesterol (Calc) 11/13/2023 84  <130 mg/dL (calc) Final   Comment: For patients with diabetes plus 1 major ASCVD risk  factor, treating to a non-HDL-C goal of <100 mg/dL  (LDL-C of <29 mg/dL) is considered a therapeutic  option.    Hgb A1c MFr Bld 11/13/2023 6.2 (H)  <5.7 % Final   Comment: For someone without known diabetes, a hemoglobin  A1c value between 5.7% and 6.4% is consistent with prediabetes and should be confirmed with a  follow-up test. . For someone with known diabetes, a value <7% indicates that their diabetes is well controlled. A1c targets should be individualized based on duration of diabetes, age, comorbid conditions, and other considerations. . This assay result is consistent with an increased risk of diabetes. . Currently, no consensus exists regarding use of hemoglobin A1c for diagnosis of diabetes for children. .    Mean Plasma Glucose 11/13/2023 131  mg/dL Final   eAG (mmol/L) 89/72/7974 7.3  mmol/L Final   Vitamin B-12 11/13/2023 390  200 - 1,100 pg/mL Final   Comment: . Please Note: Although the reference range for vitamin B12 is [phone] pg/mL, it has been reported that between 5 and 10% of patients with values between 200 and 400 pg/mL may experience neuropsychiatric and hematologic abnormalities due to occult B12 deficiency; less than 1% of patients with values above 400 pg/mL will have symptoms. .     Past Medical History:  Diagnosis Date   Barrett esophagus    BCC (basal cell carcinoma)    Cerebral cyst    Chronic otitis media of right ear    GERD (gastroesophageal reflux disease)    H/O hematuria    Hearing loss on right    Hiatal hernia    High cholesterol    Hx MRSA infection    nose   Hypertension    Prediabetes    Syncope    Past Surgical History:  Procedure Laterality Date   HERNIA REPAIR Right  01/2008   MOLE SURGERY  2014   basil cell   NOSE SURGERY     x 3-4   Current Outpatient Medications on File Prior to Visit  Medication Sig Dispense Refill   amLODipine  (NORVASC ) 5 MG tablet Take 1 tablet (5 mg total) by mouth daily. 90  tablet 1   empagliflozin  (JARDIANCE ) 25 MG TABS tablet Take 1 tablet (25 mg total) by mouth daily before breakfast. 90 tablet 1   ezetimibe  (ZETIA ) 10 MG tablet Take 1 tablet (10 mg total) by mouth daily. 90 tablet 2   fluticasone  (FLONASE ) 50 MCG/ACT nasal spray SPRAY 2 SPRAYS INTO EACH NOSTRIL EVERY EVENING 48 mL 1   icosapent  Ethyl (VASCEPA ) 1 g capsule Take 2 capsules (2 g total) by mouth 2 (two) times daily. 120 capsule 2   metoprolol  succinate (TOPROL -XL) 50 MG 24 hr tablet TAKE 1 TABLET BY MOUTH DAILY. TAKE WITH OR IMMEDIATELY FOLLOWING A MEAL. 90 tablet 0   Multiple Vitamin (MULTIVITAMIN WITH MINERALS) TABS tablet Take 1 tablet by mouth daily.     neomycin -polymyxin-hydrocortisone (CORTISPORIN) OTIC solution Place 3 drops into both ears 4 (four) times daily. 10 mL 0   omeprazole  (PRILOSEC ) 20 MG capsule Take 1 capsule (20 mg total) by mouth 2 (two) times daily before a meal. 180 capsule 3   predniSONE  (DELTASONE ) 20 MG tablet 3 tabs poqday 1-2, 2 tabs poqday 3-4, 1 tab poqday 5-6 12 tablet 0   rosuvastatin  (CRESTOR ) 40 MG tablet Take 1 tablet (40 mg total) by mouth daily. 90 tablet 3   sildenafil  (VIAGRA ) 100 MG tablet Take 0.5-1 tablets (50-100 mg total) by mouth daily as needed for erectile dysfunction. 5 tablet 11   No current facility-administered medications on file prior to visit.   Allergies  Allergen Reactions   Amoxicillin Anaphylaxis    REACTION: Rash/shortness of breath   Codeine     REACTION: iritability   Oxycodone-Acetaminophen       Can not take 5-500mg  causes chest tightness and pain    Penicillins    Social History   Socioeconomic History   Marital status: Married    Spouse name: Not on file   Number of children: 2   Years  of education: 12   Highest education level: Some college, no degree  Occupational History    Employer: [employer]  Tobacco Use   Smoking status: Never   Smokeless tobacco: Never  Vaping Use   Vaping status: Never Used  Substance and Sexual Activity   Alcohol use: No   Drug use: No   Sexual activity: Not on file  Other Topics Concern   Not on file  Social History Narrative   Patient lives at home with his wife . 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 He denies smoking, drinking.   Social Drivers of Corporate Investment Banker Strain: Low Risk  (11/16/2023)   Overall Financial Resource Strain (CARDIA)    Difficulty of Paying Living Expenses: Not very hard  Food Insecurity: No Food Insecurity (11/16/2023)   Hunger Vital Sign    Worried About Running Out of Food in the Last Year: Never true    Ran Out of Food in the Last Year: Never true  Transportation Needs: No Transportation Needs (11/16/2023)   PRAPARE - Administrator, Civil Service (Medical): No    Lack of Transportation (Non-Medical): No  Physical Activity: Insufficiently Active (11/16/2023)   Exercise Vital Sign    Days of Exercise per Week: 4 days    Minutes of Exercise per Session: 30 min  Stress: No Stress Concern Present (11/16/2023)   [employer] of Occupational Health - Occupational Stress Questionnaire    Feeling of Stress: Not at all  Social Connections: Socially Integrated (11/16/2023)  Social Advertising Account Executive    Frequency of Communication with Friends and Family: More than three times a week    Frequency of Social Gatherings with Friends and Family: Once a week    Attends Religious Services: More than 4 times per year    Active Member of [employer] or Organizations: Yes    Attends Banker Meetings: More than 4 times per year    Marital Status: Married  Catering Manager Violence: Unknown  (08/10/2022)   Received from [facility]   HITS    Physically Hurt: Not on file    Insult or Talk Down To: Not on file    Threaten Physical Harm: Not on file    Scream or Curse: Not on file      Review of Systems  All other systems reviewed and are negative.      Objective:   Physical Exam Vitals reviewed.  Constitutional:      General: He is not in acute distress.    Appearance: Normal appearance. He is well-developed and normal weight. He is not ill-appearing or toxic-appearing.  HENT:     Head: Normocephalic and atraumatic.     Right Ear: Tympanic membrane and ear canal normal.     Left Ear: Tympanic membrane and ear canal normal.     Nose: Nose normal. No congestion or rhinorrhea.     Mouth/Throat:     Mouth: Mucous membranes are moist.     Pharynx: Oropharynx is clear. No oropharyngeal exudate or posterior oropharyngeal erythema.  Eyes:     General:        Right eye: No discharge.        Left eye: No discharge.     Extraocular Movements: Extraocular movements intact.     Conjunctiva/sclera: Conjunctivae normal.     Pupils: Pupils are equal, round, and reactive to light.  Neck:     Vascular: No carotid bruit.  Cardiovascular:     Rate and Rhythm: Normal rate and regular rhythm.     Heart sounds: Normal heart sounds. No murmur heard.    No friction rub. No gallop.  Pulmonary:     Effort: Pulmonary effort is normal. No respiratory distress.     Breath sounds: Normal breath sounds. No stridor. No wheezing, rhonchi or rales.  Abdominal:     General: Abdomen is flat. Bowel sounds are normal. There is no distension.     Palpations: Abdomen is soft. There is no mass.     Tenderness: There is no abdominal tenderness. There is no guarding or rebound.     Hernia: No hernia is present.  Genitourinary:    Testes:        Left: Testicular hydrocele present.     Epididymis:     Right: No mass or tenderness.     Left: No mass or tenderness.  Musculoskeletal:      Cervical back: Normal range of motion and neck supple. No rigidity or tenderness.     Right lower leg: No swelling, deformity, tenderness or bony tenderness. No edema.     Left lower leg: No swelling, deformity, tenderness or bony tenderness. No edema.  Lymphadenopathy:     Cervical: No cervical adenopathy.  Skin:    Coloration: Skin is not jaundiced or pale.     Findings: No bruising, erythema, lesion or rash.  Neurological:     General: No focal deficit present.     Mental Status: He is alert and oriented to person,  place, and time. Mental status is at baseline.     Cranial Nerves: No cranial nerve deficit.     Sensory: No sensory deficit.     Motor: No weakness.     Coordination: Coordination normal.     Gait: Gait normal.     Deep Tendon Reflexes: Reflexes normal.  Psychiatric:        Mood and Affect: Mood normal.        Behavior: Behavior normal.        Thought Content: Thought content normal.           Assessment & Plan:  Prostate cancer screening - Plan: PSA  Benign localized prostatic hyperplasia with lower urinary tract symptoms (LUTS) - Plan: PSA  Scrotal mass - Plan: US  Scrotum  Essential hypertension  Controlled type 2 diabetes mellitus without complication, without long-term current use of insulin (HCC)  Coronary artery disease involving native coronary artery of native heart without angina pectoris  General medical exam Physical exam today is normal.  Blood pressure is excellent.  Colon cancer screening is up-to-date.  I did asked the patient to bring me a copy of his colonoscopy so I can put that in his records.  He is due for prostate cancer screening so I will check a PSA.  I believe the swelling and tightness in his scrotum is a large hydrocele.  I recommend an ultrasound to confirm.  Diabetes is well-controlled.  Cholesterol is acceptable.  The remainder of his lab work is outstanding.  Patient received the pneumonia vaccine today.

## 2023-11-17 NOTE — Telephone Encounter (Signed)
 [name] with [facility] called asking if U/S needs to be done with or without Dopplers? [phone] opt 1 then opt 5.

## 2023-11-18 LAB — PSA: PSA: 2.15 ng/mL (ref <=4.00)

## 2023-11-19 ENCOUNTER — Other Ambulatory Visit: Payer: Self-pay | Admitting: Family Medicine

## 2023-11-20 ENCOUNTER — Ambulatory Visit: Payer: Self-pay | Admitting: Family Medicine

## 2023-11-20 ENCOUNTER — Other Ambulatory Visit: Payer: Self-pay

## 2023-11-20 ENCOUNTER — Telehealth: Payer: Self-pay

## 2023-11-20 DIAGNOSIS — I10 Essential (primary) hypertension: Secondary | ICD-10-CM

## 2023-11-20 DIAGNOSIS — N5089 Other specified disorders of the male genital organs: Secondary | ICD-10-CM

## 2023-11-20 DIAGNOSIS — R0789 Other chest pain: Secondary | ICD-10-CM

## 2023-11-20 MED ORDER — METOPROLOL SUCCINATE ER 50 MG PO TB24: 50.0000 mg | ORAL_TABLET | Freq: Every day | ORAL | 2 refills | Status: AC

## 2023-11-20 NOTE — Telephone Encounter (Signed)
 Prescription Request  11/20/2023  LOV: 11/17/23  What is the name of the medication or equipment? metoprolol  succinate (TOPROL -XL) 50 MG 24 hr tablet [[id number]]   Have you contacted your pharmacy to request a refill? Yes   Which pharmacy would you like this sent to?  [id number] [address], [place] - [address] [place] [address] Phone: [phone] Fax: [phone]    Patient notified that their request is being sent to the clinical staff for review and that they should receive a response within 2 business days.   Please advise at [facility] [phone]

## 2023-11-20 NOTE — Addendum Note (Signed)
 Addended by: DUANNE LOWERS T on: 11/20/2023 07:06 AM   Modules accepted: Level of Service

## 2023-11-23 ENCOUNTER — Other Ambulatory Visit: Payer: Self-pay

## 2023-11-29 ENCOUNTER — Ambulatory Visit
Admission: RE | Admit: 2023-11-29 | Discharge: 2023-11-29 | Disposition: A | Discharge status: Home / Self Care | Source: Ambulatory Visit | Attending: Family Medicine | Admitting: Family Medicine

## 2023-11-29 DIAGNOSIS — N5089 Other specified disorders of the male genital organs: Secondary | ICD-10-CM

## 2023-12-19 ENCOUNTER — Ambulatory Visit (INDEPENDENT_AMBULATORY_CARE_PROVIDER_SITE_OTHER): Admitting: Urology

## 2023-12-19 VITALS — BP 126/76 | HR 62 | Ht 67.0 in | Wt 160.0 lb

## 2023-12-19 DIAGNOSIS — K4090 Unilateral inguinal hernia, without obstruction or gangrene, not specified as recurrent: Secondary | ICD-10-CM

## 2023-12-19 DIAGNOSIS — N5089 Other specified disorders of the male genital organs: Secondary | ICD-10-CM | POA: Diagnosis not present

## 2023-12-19 NOTE — Progress Notes (Signed)
   12/19/2023 1:59 PM   Nathaniel Porter 04/26/1960 [id number]  Reason for visit: Left scrotal swelling  History: Reports at least 1 year of left-sided scrotal swelling, ultrasound from PCP was suggestive of possible hydrocele versus spermatocele No pain but bulky and bothersome, interested in treatment options Denies any urinary symptoms  Physical Exam: BP 126/76   Pulse 62   Ht 5' 7 (1.702 m)   Wt 160 lb (72.6 kg)   BMI 25.06 kg/m  Moderate left scrotal swelling, most consistent with hydrocele  Imaging/labs: Personally viewed and interpreted the scrotal ultrasound concerning for moderate size left hydrocele   Plan:   Left scrotal swelling: With his history of hernia as well as CT showing left-sided hernia with fat a few years ago and indeterminate ultrasound and exam I recommended a CT to confirm no evidence of bowel in the left scrotum.  We discussed options if this is truly just a hydrocele including observation, aspiration, or most definitive management with hydrocelectomy.  Risks of bleeding, infection, recurrence, soreness/bruising, prolonged recovery discussed at length. CT for further evaluation of hernia verse hydrocele, tentatively schedule left hydrocelectomy CT will need to be completed prior   Nathaniel JAYSON Burnet, MD  [facility] [address], [address] [place], [place] [address] [phone]

## 2023-12-19 NOTE — Patient Instructions (Addendum)
 Please Call [phone] to schedule CT scan   Hydrocele, Adult A hydrocele is a collection of fluid in the loose pouch of skin that holds the testicles (scrotum). It can occur in one or both testicles. This may happen because: The amount of fluid produced in the scrotum is not absorbed by the rest of the body. Fluid from the abdomen fills the scrotum. Normally, the testicles develop in the abdomen and then drop into the scrotum before birth. The tube that the testicles travel through usually closes after the testicles drop. If the tube does not close, fluid from the abdomen can fill the scrotum. This is not very common in adults. What are the causes? A hydrocele may be caused by: An injury to the scrotum. An infection. Decreased blood flow to the scrotum. Twisting of a testicle (testicular torsion). A birth defect. A tumor or cancer of the testicle. Sometimes, the cause is not known. What are the signs or symptoms? A hydrocele feels like a water-filled balloon. It may also feel heavy. Other symptoms include: Swelling of the scrotum. The swelling may decrease when you lie down. You may also notice more swelling at night than in the morning. This is called a communicating hydrocele, in which the fluid in the scrotum goes back into the abdominal cavity when the position of the scrotum changes. Swelling of the groin. Mild discomfort in the scrotum. Pain. This can develop if the hydrocele was caused by infection or twisting. The larger the hydrocele, the more likely you are to have pain. Swelling may also cause pain. How is this diagnosed? This condition may be diagnosed based on a physical exam and your medical history. You may also have tests, including: Imaging tests, such as an ultrasound. A transillumination test. This test takes place in a dark room where a light is placed on the skin of the scrotum. Clear liquid will not impede the light and the scrotum will be illuminated. This helps a  health care provider distinguish a hydrocele from a tumor. Blood or urine tests. How is this treated? Most hydroceles go away on their own. If you have no discomfort or pain, your health care provider may suggest close monitoring of your condition until the condition goes away or symptoms develop. This is called watch and wait or watchful waiting. If treatment is needed, it may include: Treating an underlying condition. This may include taking an antibiotic medicine to treat an infection. Having surgery to stop fluid from collecting in the scrotum. Having surgery to drain the fluid. Surgery may include: Hydrocelectomy. For this procedure, an incision is made in the scrotum to remove the fluid. Needle aspiration. A needle is used to drain fluid. However, the fluid buildup will come back quickly and may lead to an infection of the scrotum. This is rarely done. Follow these instructions at home: Medicines Take over-the-counter and prescription medicines only as told by your health care provider. If you were prescribed an antibiotic medicine, take it as told by your health care provider. Do not stop taking the antibiotic even if you start to feel better. General instructions Watch the hydrocele for any changes. Keep all follow-up visits. This is important. Contact a health care provider if: You notice any changes in the hydrocele. The swelling in your scrotum or groin gets worse. The hydrocele becomes red, firm, painful, or tender to the touch. You have a fever. Get help right away if you: Develop a lot of pain or your pain becomes worse. Have  chills. Have a high fever. Summary A hydrocele is a collection of fluid in the loose pouch of skin that holds the testicles (scrotum). A hydrocele can cause swelling, discomfort, and pain. In adults, the cause of a hydrocele may not be known. However, it is sometimes caused by an infection or the twisting of a testicle. Hydroceles often go away on  their own. If a hydrocele causes pain, treating the underlying cause may be needed to ease the pain. This information is not intended to replace advice given to you by your health care provider. Make sure you discuss any questions you have with your health care provider. Surgery to Remove Fluid Build-Up in the Scrotum (Hydrocelectomy) in Adults: What to Expect  Hydrocelectomy is surgery to remove a build-up of fluid in the pouch that holds the testicles (scrotum). This build-up is called a hydrocele. You may need a hydrocelectomy if the hydrocele: Is causing pain. Is getting bigger. Tell a health care provider about: Any allergies you have. All medicines you take. These include vitamins, herbs, eye drops, and creams. Any problems you or family members have had with anesthesia. Any bleeding problems you have. Any surgeries you've had. Any medical problems you have. What are the risks? Your health care provider will talk with you about risks. These may include: Bleeding. Infection. Damage to nearby structures or organs. Allergies to medicines. A second surgery. What happens before? When to stop eating and drinking Eat and drink as you've been told. If you don't, your procedure to remove fluid in your scrotum may be delayed or canceled. You may be told this: 8 hours before Do not eat meat, fried foods, or fatty foods. Eat only light foods, such as toast or crackers. All liquids are OK except energy drinks and alcohol. 6 hours before Stop eating. Drink only clear liquids, such as water, clear fruit juice, black coffee, plain tea, and sports drinks. Do not drink energy drinks or alcohol. 2 hours before Stop drinking all liquids. You may be allowed to take medicines with small sips of water. Medicines Ask about changing or stopping: Any medicines you take. Any vitamins, herbs, or supplements you take. Do not take aspirin or ibuprofen unless you're told to. Surgery safety For your  safety, you may: Need to wash your skin with a soap that kills germs. Get antibiotics. Have your surgery site marked. Have hair removed at the surgery site. General instructions Do not smoke, vape, or use nicotine or tobacco for at least 4 weeks before the surgery. Ask if you'll be staying overnight in the hospital. If you'll be going home right after the surgery, plan to have a responsible adult: Drive you home from the hospital or clinic. You won't be allowed to drive. Stay with you for the time you're told. What happens during the hydrocelectomy? An IV will be put into a vein in your hand or arm. You'll be given: A sedative to help you relax. Anesthesia to keep you from feeling pain. A small cut will be made in the groin area or in the scrotum. Another small cut will be made in the sac where the fluid has built up. The fluid will be drained and the hydrocele sac will be removed. The hydrocele will be closed with stitches. If your hydrocele is large, you may have a thin, rubber drain placed in the cut area to keep draining fluid after surgery. The cut in your scrotum or groin will be closed with stitches, skin glue, or tape  strips. A bandage will be placed over the cut. You may have an athletic support strap placed. This is also called scrotal support. These steps may vary. Ask what you can expect. What happens after? You'll be watched closely until you leave. This includes checking your pain level, blood pressure, heart rate, and breathing rate. You'll be given pain medicine as needed. This information is not intended to replace advice given to you by your health care provider. Make sure you discuss any questions you have with your health care provider. Document Revised: 04/17/2023 Document Reviewed: 04/17/2023 Elsevier Patient Education  2025 [employer].

## 2023-12-20 ENCOUNTER — Other Ambulatory Visit: Payer: Self-pay

## 2023-12-20 DIAGNOSIS — N433 Hydrocele, unspecified: Secondary | ICD-10-CM

## 2023-12-20 NOTE — Progress Notes (Signed)
 Surgical Physician Order [facility] [place]  Dr. Redell Burnet, MD  * Scheduling expectation : Patient prefers February 2026.  CT needs to be completed prior to surgery, ordered  *Length of Case: 30 minutes  *Clearance needed: no  *Anticoagulation Instructions: Hold all anticoagulants  *Aspirin Instructions: Hold Aspirin  *Post-op visit Date/Instructions: 8 weeks PA wound check  *Diagnosis: Hydrocele  *Procedure: left Hydrocele excision groin/unilateral scrotal approach (44959)   Additional orders: N/A  -Admit type: OUTpatient  -Anesthesia: General  -VTE Prophylaxis Standing Order SCD's       Other:   -Standing Lab Orders Per Anesthesia    Lab other: None  -Standing Test orders EKG/Chest x-ray per Anesthesia       Test other:   - Medications:  Ancef 2gm IV  -Other orders:  N/A

## 2023-12-25 ENCOUNTER — Ambulatory Visit: Admission: RE | Admit: 2023-12-25 | Discharge: 2023-12-25 | Attending: Urology

## 2023-12-25 ENCOUNTER — Telehealth: Payer: Self-pay | Admitting: Family Medicine

## 2023-12-25 DIAGNOSIS — K4090 Unilateral inguinal hernia, without obstruction or gangrene, not specified as recurrent: Secondary | ICD-10-CM

## 2023-12-25 NOTE — Telephone Encounter (Signed)
 Copied from CRM #[id number]. Topic: Appointments - Scheduling Inquiry for Clinic >> Dec 25, 2023 11:22 AM Tinnie BROCKS wrote: Reason for CRM: Pt states we should be receiving paperwork from [facility] shortly to clear him for shoulder surgery. Patient wants to know if he needs to come in for an appointment for this or if he is able to fill out the paperwork with his recent visit info. Requesting call back at [phone]

## 2023-12-30 ENCOUNTER — Ambulatory Visit: Payer: Self-pay | Admitting: Urology

## 2024-01-01 NOTE — Telephone Encounter (Signed)
 Son follows up this week- patient to call at end of week to reschedule with specific date.

## 2024-01-02 ENCOUNTER — Telehealth: Payer: Self-pay

## 2024-01-02 NOTE — Progress Notes (Signed)
° °  [facility] Posting Form  Surgery Date: Date: 02/26/2024  Surgeon: Dr. Redell Burnet, MD  Inpt ( No  )   Outpt (Yes)   Obs ( No  )   Diagnosis: N43.3 Left Hydrocele  -CPT: [phone]  Surgery: Left Hydrocelectomy  Stop Anticoagulations: Yes and also hold ASA  Cardiac/Medical/Pulmonary Clearance needed: no  *Orders entered into EPIC  Date: 01/02/2024   *Case booked in [place]  Date: 01/02/2024  *Notified pt of Surgery: Date: 01/02/2024  PRE-OP UA & CX: no  *Placed into Prior Authorization Work [place] Date: 01/02/2024  Assistant/laser/rep:No

## 2024-01-02 NOTE — Telephone Encounter (Signed)
 Per Dr. Francisca, Patient is to be scheduled for Left Hydrocelectomy   Nathaniel Porter was contacted and possible surgical dates were discussed, Monday February 9th, 2026 was agreed upon for surgery.   Patient was directed to call [phone] between 1-3pm the day before surgery to find out surgical arrival time.  Instructions were given not to eat or drink from midnight on the night before surgery and have a driver for the day of surgery. On the surgery day patient was instructed to enter through the Medical Mall entrance of [facility] report the Same Day Surgery desk.   Pre-Admit Testing will be in contact via phone to set up an interview with the anesthesia team to review your history and medications prior to surgery.   Reminder of this information was sent via MyChart to the patient.

## 2024-01-17 ENCOUNTER — Other Ambulatory Visit: Payer: Self-pay | Admitting: Family Medicine

## 2024-01-17 DIAGNOSIS — I10 Essential (primary) hypertension: Secondary | ICD-10-CM

## 2024-01-17 DIAGNOSIS — E119 Type 2 diabetes mellitus without complications: Secondary | ICD-10-CM

## 2024-01-28 ENCOUNTER — Other Ambulatory Visit: Payer: Self-pay | Admitting: Family Medicine

## 2024-01-28 DIAGNOSIS — E782 Mixed hyperlipidemia: Secondary | ICD-10-CM

## 2024-02-19 ENCOUNTER — Other Ambulatory Visit: Payer: Self-pay

## 2024-02-19 ENCOUNTER — Inpatient Hospital Stay
Admission: RE | Admit: 2024-02-19 | Discharge: 2024-02-19 | Disposition: A | Source: Ambulatory Visit | Attending: Urology

## 2024-02-19 DIAGNOSIS — R7309 Other abnormal glucose: Secondary | ICD-10-CM

## 2024-02-19 DIAGNOSIS — R0789 Other chest pain: Secondary | ICD-10-CM

## 2024-02-19 DIAGNOSIS — E7800 Pure hypercholesterolemia, unspecified: Secondary | ICD-10-CM

## 2024-02-19 DIAGNOSIS — I10 Essential (primary) hypertension: Secondary | ICD-10-CM

## 2024-02-19 DIAGNOSIS — R7303 Prediabetes: Secondary | ICD-10-CM

## 2024-02-19 HISTORY — DX: Prediabetes: R73.03

## 2024-02-19 HISTORY — DX: Atherosclerotic heart disease of native coronary artery without angina pectoris: I25.10

## 2024-02-19 NOTE — Patient Instructions (Addendum)
 Your procedure is scheduled on: Monday 02/26/24 To find out your arrival time, please call [phone] between 1PM - 3PM on: Friday 02/23/24 Report to the Registration Desk on the 1st floor of the Medical Mall. If your arrival time is 6:00 am, do not arrive before that time as the Medical Mall entrance doors do not open until 6:00 am.  REMEMBER: Instructions that are not followed completely may result in serious medical risk, up to and including death; or upon the discretion of your surgeon and anesthesiologist your surgery may need to be rescheduled.  Do not eat food or drink any liquids after midnight the night before surgery.  No gum chewing or hard candies.  One week prior to surgery: Stop Anti-inflammatories (NSAIDS) such as Advil, Aleve, Ibuprofen, Motrin, Naproxen, Naprosyn and Aspirin based products such as Excedrin, Goody's Powder, BC Powder.  You may however, continue to take Tylenol  if needed for pain up until the day of surgery.  Stop ANY OVER THE COUNTER supplements and vitamins for at least 7 days until after surgery.  **Follow guidelines for insulin and diabetes medications.** Hold Jardiance , last dose Wednesday 02/21/24  Continue taking all of your other prescription medications up until the day of surgery. EXCEPTION: No Sildenafil /Viagra  for 48 hours before surgery.  ON THE DAY OF SURGERY ONLY TAKE THESE MEDICATIONS WITH SIPS OF WATER:  rosuvastatin  (CRESTOR ) 40 MG tablet  omeprazole  (PRILOSEC ) 20 MG capsule  metoprolol  succinate (TOPROL -XL) 50 MG 24 hr tablet  ezetimibe  (ZETIA ) 10 MG tablet  amLODipine  (NORVASC ) 5 MG tablet   No Alcohol for 24 hours before or after surgery.  No Smoking including e-cigarettes for 24 hours before surgery.  No chewable tobacco products for at least 6 hours before surgery.  No nicotine patches on the day of surgery.  Do not use any recreational drugs for at least a week (preferably 2 weeks) before your surgery.  Please be advised  that the combination of cocaine and anesthesia may have negative outcomes, up to and including death. If you test positive for cocaine, your surgery will be cancelled.  On the morning of surgery brush your teeth with toothpaste and water, you may rinse your mouth with mouthwash if you wish. Do not swallow any toothpaste or mouthwash.  Use CHG Soap or wipes as directed on instruction sheet. (You can pick this up at our office in the [facility], the building to the left of the [employer], [address] at [address]  Do not shave body hair from the neck down 48 hours before surgery.  Do not wear lotions, powders, or perfumes on the day of surgery   Wear comfortable clothing (specific to your surgery type) to the hospital.  Do not wear jewelry, make-up, hairpins, clips or nail polish.  For welded (permanent) jewelry: bracelets, anklets, waist bands, etc.  Please have this removed prior to surgery.  If it is not removed, there is a chance that hospital personnel will need to cut it off on the day of surgery.  Contact lenses, hearing aids and dentures may not be worn into surgery.  Do not bring valuables to the hospital. [facility] is not responsible for any missing/lost belongings or valuables.   Notify your doctor if there is any change in your medical condition (cold, fever, infection).  After surgery, you can help prevent lung complications by doing breathing exercises.  Take deep breaths and cough every 1-2 hours. Your doctor may order a device  called an Facilities Manager to help you take deep breaths.  If you are being discharged the day of surgery, you will not be allowed to drive home. You will need a responsible individual to drive you home and stay with you for 24 hours after surgery.   Please call the Pre-admissions Testing Dept. at [phone] if you have any questions about these instructions.  Surgery Visitation Policy:  Patients having  surgery or a procedure may have two visitors.  Children under the age of 66 must have an adult with them who is not the patient.  Merchandiser, Retail to address health-related social needs:  [email or web address]                                                                                                             Preparing for Surgery with CHLORHEXIDINE GLUCONATE (CHG) Soap  Chlorhexidine Gluconate (CHG) Soap  o An antiseptic cleaner that kills germs and bonds with the skin to continue killing germs even after washing  o Used for showering the night before surgery and morning of surgery  Before surgery, you can play an important role by reducing the number of germs on your skin.  CHG (Chlorhexidine gluconate) soap is an antiseptic cleanser which kills germs and bonds with the skin to continue killing germs even after washing.  Please do not use if you have an allergy to CHG or antibacterial soaps. If your skin becomes reddened/irritated stop using the CHG.  1. Shower the NIGHT BEFORE SURGERY with CHG soap.  2. If you choose to wash your hair, wash your hair first as usual with your normal shampoo.  3. After shampooing, rinse your hair and body thoroughly to remove the shampoo.  4. Use CHG as you would any other liquid soap. You can apply CHG directly to the skin and wash gently with a clean washcloth.  5. Apply the CHG soap to your body only from the neck down. Do not use on open wounds or open sores. Avoid contact with your eyes, ears, mouth, and genitals (private parts). Wash face and genitals (private parts) with your normal soap.  6. Wash thoroughly, paying special attention to the area where your surgery will be performed.  7. Thoroughly rinse your body with warm water.  8. Do not shower/wash with your normal soap after using and rinsing off the CHG soap.  9. Do not use lotions, oils, etc., after showering with CHG.  10. Pat yourself dry with a  clean towel.  11. Wear clean pajamas to bed the night before surgery.  12. Place clean sheets on your bed the night of your shower and do not sleep with pets.  13. Do not apply any deodorants/lotions/powders.  14. Please wear clean clothes to the hospital.  15. Remember to brush your teeth with your regular toothpaste.

## 2024-02-21 ENCOUNTER — Inpatient Hospital Stay: Admission: RE | Admit: 2024-02-21 | Discharge: 2024-02-21 | Attending: Urology

## 2024-02-21 DIAGNOSIS — I10 Essential (primary) hypertension: Secondary | ICD-10-CM

## 2024-02-21 DIAGNOSIS — E7800 Pure hypercholesterolemia, unspecified: Secondary | ICD-10-CM

## 2024-02-21 DIAGNOSIS — R0789 Other chest pain: Secondary | ICD-10-CM

## 2024-02-26 ENCOUNTER — Encounter: Admission: RE | Payer: Self-pay | Source: Home / Self Care

## 2024-02-26 ENCOUNTER — Ambulatory Visit: Admission: RE | Admit: 2024-02-26 | Source: Home / Self Care | Admitting: Urology

## 2024-02-26 ENCOUNTER — Encounter: Payer: Self-pay | Admitting: Urgent Care

## 2024-04-22 ENCOUNTER — Ambulatory Visit: Admitting: Physician Assistant

## 2024-11-15 ENCOUNTER — Other Ambulatory Visit

## 2024-11-18 ENCOUNTER — Encounter: Admitting: Family Medicine
